# Patient Record
Sex: Female | Born: 2000 | Race: White | Hispanic: No | Marital: Married | Smoking: Never smoker
Health system: Southern US, Community
[De-identification: ages and names within clinical notes are randomized; demographics above are authoritative.]

## PROBLEM LIST (undated history)

## (undated) DIAGNOSIS — F419 Anxiety disorder, unspecified: Secondary | ICD-10-CM

## (undated) DIAGNOSIS — R1013 Epigastric pain: Secondary | ICD-10-CM

## (undated) DIAGNOSIS — L83 Acanthosis nigricans: Secondary | ICD-10-CM

## (undated) DIAGNOSIS — R000 Tachycardia, unspecified: Secondary | ICD-10-CM

## (undated) DIAGNOSIS — E669 Obesity, unspecified: Secondary | ICD-10-CM

## (undated) DIAGNOSIS — Z7289 Other problems related to lifestyle: Secondary | ICD-10-CM

## (undated) DIAGNOSIS — F329 Major depressive disorder, single episode, unspecified: Secondary | ICD-10-CM

## (undated) DIAGNOSIS — Z9289 Personal history of other medical treatment: Secondary | ICD-10-CM

## (undated) DIAGNOSIS — F32A Depression, unspecified: Secondary | ICD-10-CM

## (undated) DIAGNOSIS — F319 Bipolar disorder, unspecified: Secondary | ICD-10-CM

## (undated) DIAGNOSIS — K219 Gastro-esophageal reflux disease without esophagitis: Secondary | ICD-10-CM

## (undated) DIAGNOSIS — IMO0001 Reserved for inherently not codable concepts without codable children: Secondary | ICD-10-CM

## (undated) DIAGNOSIS — N920 Excessive and frequent menstruation with regular cycle: Secondary | ICD-10-CM

## (undated) DIAGNOSIS — E049 Nontoxic goiter, unspecified: Secondary | ICD-10-CM

## (undated) DIAGNOSIS — E282 Polycystic ovarian syndrome: Secondary | ICD-10-CM

## (undated) DIAGNOSIS — E119 Type 2 diabetes mellitus without complications: Secondary | ICD-10-CM

## (undated) HISTORY — DX: Acanthosis nigricans: L83

## (undated) HISTORY — DX: Obesity, unspecified: E66.9

## (undated) HISTORY — DX: Excessive and frequent menstruation with regular cycle: N92.0

## (undated) HISTORY — DX: Reserved for inherently not codable concepts without codable children: IMO0001

## (undated) HISTORY — DX: Gastro-esophageal reflux disease without esophagitis: K21.9

## (undated) HISTORY — DX: Tachycardia, unspecified: R00.0

## (undated) HISTORY — DX: Epigastric pain: R10.13

## (undated) HISTORY — DX: Personal history of other medical treatment: Z92.89

## (undated) HISTORY — DX: Nontoxic goiter, unspecified: E04.9

## (undated) HISTORY — PX: TYMPANOSTOMY TUBE PLACEMENT: SHX32

---
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
## 2018-01-16 ENCOUNTER — Ambulatory Visit: Payer: BLUE CROSS/BLUE SHIELD | Admitting: Family Medicine

## 2018-01-16 ENCOUNTER — Encounter: Payer: Self-pay | Admitting: Family Medicine

## 2018-01-16 VITALS — BP 131/81 | HR 63 | Temp 98.5°F | Resp 17 | Ht 65.0 in | Wt 220.0 lb

## 2018-01-16 DIAGNOSIS — Z113 Encounter for screening for infections with a predominantly sexual mode of transmission: Secondary | ICD-10-CM | POA: Diagnosis not present

## 2018-01-16 DIAGNOSIS — Z30431 Encounter for routine checking of intrauterine contraceptive device: Secondary | ICD-10-CM | POA: Diagnosis not present

## 2018-01-16 NOTE — Progress Notes (Signed)
1/14/202011:55 AM  [name] 05/21/2000, 18 y.o. female [id number]  Chief Complaint  Patient presents with  . Exposure to STD    HPI:   Patient is a 18 y.o. female who presents today for concerns of STD exposure  Her now ex-finance has cheated on her They had been having unprotected sex Denies any vaginal discharge, pelvic pain, lower abd pain, nausea, vomiting, fever or chills Did have some break through bleeding this morning Has mirena IUD in place, requesting string check Denies any h/o STDs Has completed HBV and HPV vaccine series  Fall Risk  01/16/2018 06/23/2016 06/06/2016 02/27/2016 10/05/2014  Falls in the past year? 0 No No No No     Depression screen [facility] 2/9 01/16/2018 06/23/2016 06/06/2016  Decreased Interest 0 0 0  Down, Depressed, Hopeless 0 0 0  PHQ - 2 Score 0 0 0  Altered sleeping 0 - -  Tired, decreased energy 0 - -  Change in appetite 0 - -  Feeling bad or failure about yourself  0 - -  Trouble concentrating 0 - -  Moving slowly or fidgety/restless 0 - -  Suicidal thoughts 0 - -  PHQ-9 Score 0 - -  Difficult doing work/chores Not difficult at all - -    No Known Allergies  Prior to Admission medications   Medication Sig Start Date End Date Taking? Authorizing Provider  glucose blood (BAYER CONTOUR NEXT TEST) test strip Check sugar 6x day Patient not taking: Reported on 01/16/2018 04/30/14   Verneda Skill, FNP  metFORMIN (GLUCOPHAGE-XR) 500 MG 24 hr tablet Take three tablets by mouth once daily with supper. Patient not taking: Reported on 06/23/2016 06/15/15   Verneda Skill, FNP  ondansetron (ZOFRAN) 4 MG tablet Take 1 tablet (4 mg total) by mouth every 8 (eight) hours as needed for nausea or vomiting. Patient not taking: Reported on 01/16/2018 06/08/16   Sherren Mocha, MD  ranitidine (ZANTAC) 150 MG tablet Take 1 tablet (150 mg total) by mouth 2 (two) times daily. Patient not taking: Reported on 06/06/2016 06/03/16   Clancy Gourd, NP    Past  Medical History:  Diagnosis Date  . Anxiety   . Bipolar affective (HCC)    with depression and anxiety.  . Deliberate self-cutting    last done 1 month ago to left arm  . Depression   . Diabetes mellitus without complication (HCC)    Type II  . Menorrhagia    required transfusion in 04/2013  . Obesity   . Polycystic ovary disease   . Reflux     Past Surgical History:  Procedure Laterality Date  . TYMPANOSTOMY TUBE PLACEMENT      Social History   Tobacco Use  . Smoking status: Never Smoker  . Smokeless tobacco: Never Used  . Tobacco comment: 95 year old brother smokes outside  Substance Use Topics  . Alcohol use: No    Alcohol/week: 0.0 standard drinks    Family History  Problem Relation Age of Onset  . Diabetes Mother   . Bipolar disorder Father   . Heart disease Father 75       AMI  . Diabetes Maternal Aunt   . Diabetes Maternal Uncle   . Heart disease Maternal Uncle   . Cancer Paternal Aunt        liver cancer  . Cancer Maternal Grandmother 17       lung cancer  . Hypertension Maternal Grandmother   . Diabetes Maternal Grandfather   .  Heart disease Maternal Grandfather   . Schizophrenia Paternal Grandmother     ROS Per hpi  OBJECTIVE:  Blood pressure 131/81, pulse 63, temperature 98.5 F (36.9 C), temperature source Oral, resp. rate 17, height 5\' 5"  (1.651 m), weight 220 lb (99.8 kg), SpO2 98 %. Body mass index is 36.61 kg/m.   Physical Exam Vitals signs and nursing note reviewed. Exam conducted with a chaperone present.  Constitutional:      Appearance: She is well-developed.  HENT:     Head: Normocephalic and atraumatic.  Eyes:     General: No scleral icterus.    Conjunctiva/sclera: Conjunctivae normal.     Pupils: Pupils are equal, round, and reactive to light.  Neck:     Musculoskeletal: Neck supple.  Pulmonary:     Effort: Pulmonary effort is normal.  Genitourinary:    Labia:        Right: No rash or lesion.        Left: No rash or  lesion.      Vagina: Normal.     Cervix: Normal.     Comments: Strings visualized at os Skin:    General: Skin is warm and dry.  Neurological:     Mental Status: She is alert and oriented to person, place, and time.     ASSESSMENT and PLAN  1. Screening for STD (sexually transmitted disease) - WET PREP FOR TRICH, YEAST, CLUE - RPR - HIV Antibody (routine testing w rflx) - GC/Chlamydia Probe Amp([facility])  2. IUD check up Strings visualized   No follow-ups on file.    Myles Lipps, MD Primary Care at [facility] [address] [place], [place] [address] Ph.  [phone] Fax [phone]

## 2018-01-16 NOTE — Patient Instructions (Signed)
° ° ° °  If you have lab work done today you will be contacted with your lab results within the next 2 weeks.  If you have not heard from us then please contact us. The fastest way to get your results is to register for My Chart. ° ° °IF you received an x-ray today, you will receive an invoice from [facility]. Please contact [facility] at [phone] with questions or concerns regarding your invoice.  ° °IF you received labwork today, you will receive an invoice from [facility]. Please contact [facility] at [phone] with questions or concerns regarding your invoice.  ° °Our billing staff will not be able to assist you with questions regarding bills from these companies. ° °You will be contacted with the lab results as soon as they are available. The fastest way to get your results is to activate your My Chart account. Instructions are located on the last page of this paperwork. If you have not heard from us regarding the results in 2 weeks, please contact this office. °  ° ° ° °

## 2018-01-17 LAB — WET PREP FOR TRICH, YEAST, CLUE
Clue Cell Exam: NEGATIVE
Trichomonas Exam: NEGATIVE
Yeast Exam: NEGATIVE

## 2018-01-17 LAB — HIV ANTIBODY (ROUTINE TESTING W REFLEX): HIV Screen 4th Generation wRfx: NONREACTIVE

## 2018-01-17 LAB — RPR: RPR Ser Ql: NONREACTIVE

## 2018-01-18 LAB — GC/CHLAMYDIA PROBE AMP
Chlamydia trachomatis, NAA: NEGATIVE
Neisseria gonorrhoeae by PCR: NEGATIVE

## 2018-07-19 ENCOUNTER — Ambulatory Visit (INDEPENDENT_AMBULATORY_CARE_PROVIDER_SITE_OTHER): Payer: BC Managed Care – PPO | Admitting: Family Medicine

## 2018-07-19 ENCOUNTER — Other Ambulatory Visit (HOSPITAL_COMMUNITY)
Admission: RE | Admit: 2018-07-19 | Discharge: 2018-07-19 | Disposition: A | Discharge status: Home / Self Care | Payer: BC Managed Care – PPO | Source: Ambulatory Visit | Attending: Family Medicine | Admitting: Family Medicine

## 2018-07-19 ENCOUNTER — Other Ambulatory Visit: Payer: Self-pay

## 2018-07-19 VITALS — BP 123/76 | HR 107 | Temp 98.3°F | Resp 18 | Ht 65.0 in | Wt 231.0 lb

## 2018-07-19 DIAGNOSIS — N760 Acute vaginitis: Secondary | ICD-10-CM

## 2018-07-19 DIAGNOSIS — Z202 Contact with and (suspected) exposure to infections with a predominantly sexual mode of transmission: Secondary | ICD-10-CM | POA: Insufficient documentation

## 2018-07-19 DIAGNOSIS — B9689 Other specified bacterial agents as the cause of diseases classified elsewhere: Secondary | ICD-10-CM | POA: Insufficient documentation

## 2018-07-19 LAB — POCT WET + KOH PREP
Trich by wet prep: ABSENT
Yeast by KOH: ABSENT
Yeast by wet prep: ABSENT

## 2018-07-19 MED ORDER — METRONIDAZOLE 500 MG PO TABS: 500.0000 mg | ORAL_TABLET | Freq: Two times a day (BID) | ORAL | 0 refills | Status: DC

## 2018-07-19 NOTE — Progress Notes (Signed)
Acute Office Visit  Subjective:    Patient ID: [name], female    DOB: 13-Jul-2000, 18 y.o.   MRN: [id number]  Chief Complaint  Patient presents with  . Exposure to STD    uses protection    HPI Patient is here today for STD concerns-pt states previous partner notified her that he was exposed to an STD by HD. Pt does not remember what STD reported. Pt and partner used protection but pt currently with a new partner and not using protection.  Pt has not completed a pap smear -21 in 2/21  Past Medical History:  Diagnosis Date  . Anxiety   . Bipolar affective ([place])    with depression and anxiety.  . Deliberate self-cutting    last done 1 month ago to left arm  . Depression   . Diabetes mellitus without complication ([place])    Type II  . Menorrhagia    required transfusion in 04/2013  . Obesity   . Polycystic ovary disease   . Reflux     Past Surgical History:  Procedure Laterality Date  . TYMPANOSTOMY TUBE PLACEMENT      Family History  Problem Relation Age of Onset  . Diabetes Mother   . Bipolar disorder Father   . Heart disease Father 80       AMI  . Diabetes Maternal Aunt   . Diabetes Maternal Uncle   . Heart disease Maternal Uncle   . Cancer Paternal Aunt        liver cancer  . Cancer Maternal Grandmother 29       lung cancer  . Hypertension Maternal Grandmother   . Diabetes Maternal Grandfather   . Heart disease Maternal Grandfather   . Schizophrenia Paternal Grandmother     Social History   Socioeconomic History  . Marital status: Single    Spouse name: Not on file  . Number of children: 0  . Years of education: Not on file  . Highest education level: Not on file  Occupational History  . Not on file  Social Needs  . Financial resource strain: Not on file  . Food insecurity    Worry: Not on file    Inability: Not on file  . Transportation needs    Medical: Not on file    Non-medical: Not on file  Tobacco Use  . Smoking status: Never  Smoker  . Smokeless tobacco: Never Used  . Tobacco comment: 29 year old brother smokes outside  Substance and Sexual Activity  . Alcohol use: No    Alcohol/week: 0.0 standard drinks  . Drug use: No  . Sexual activity: Yes    Partners: Male    Birth control/protection: Pill, I.U.D.    Comment: 04/27/2016 unprotected   Lifestyle  . Physical activity    Days per week: Not on file    Minutes per session: Not on file  . Stress: Not on file  Relationships  . Social Herbalist on phone: Not on file    Gets together: Not on file    Attends religious service: Not on file    Active member of club or organization: Not on file    Attends meetings of clubs or organizations: Not on file    Relationship status: Not on file  . Intimate partner violence    Fear of current or ex partner: Not on file    Emotionally abused: Not on file    Physically abused: Not on  file    Forced sexual activity: Not on file  Other Topics Concern  . Not on file  Social History Narrative   Lives at home with mother, father, and 20 year old brother.    Family history includes mother with a hysterectomy due to bleeding after birth of second child.    Outpatient Medications Prior to Visit  Medication Sig Dispense Refill  . glucose blood (BAYER CONTOUR NEXT TEST) test strip Check sugar 6x day (Patient not taking: Reported on 01/16/2018) 200 each 12  . metFORMIN (GLUCOPHAGE-XR) 500 MG 24 hr tablet Take three tablets by mouth once daily with supper. (Patient not taking: Reported on 06/23/2016) 90 tablet 3  . ondansetron (ZOFRAN) 4 MG tablet Take 1 tablet (4 mg total) by mouth every 8 (eight) hours as needed for nausea or vomiting. (Patient not taking: Reported on 01/16/2018) 20 tablet 0  . ranitidine (ZANTAC) 150 MG tablet Take 1 tablet (150 mg total) by mouth 2 (two) times daily. (Patient not taking: Reported on 06/06/2016) 60 tablet 0   No facility-administered medications prior to visit.     No Known  Allergies  ROS No vaginal discharge No abdominal pain    Objective:    Physical Exam  Constitutional: She appears well-developed and well-nourished.  Cardiovascular: Normal rate, regular rhythm, normal heart sounds and intact distal pulses.  Pulmonary/Chest: Effort normal and breath sounds normal.  Abdominal: Soft. Bowel sounds are normal.  Genitourinary:    Uterus normal.     Vaginal discharge present.   bimanual-no cervical motion tenderness. Discharge green thick  BP 123/76 (BP Location: Right Arm, Patient Position: Sitting, Cuff Size: Large)   Pulse (!) 107   Temp 98.3 F (36.8 C)   Resp 18   Ht 5\' 5"  (1.651 m)   Wt 231 lb (104.8 kg)   SpO2 95%   BMI 38.44 kg/m  Wt Readings from Last 3 Encounters:  07/19/18 231 lb (104.8 kg)  01/16/18 220 lb (99.8 kg) (99 %, Z= 2.21)*  06/23/16 224 lb 9.6 oz (101.9 kg) (99 %, Z= 2.25)*   * Growth percentiles are based on CDC (Girls, 2-20 Years) data.    Health Maintenance Due  Topic Date Due  . PNEUMOCOCCAL POLYSACCHARIDE VACCINE AGE 52-64 HIGH RISK  02/12/2000  . FOOT EXAM  02/12/2008  . OPHTHALMOLOGY EXAM  02/12/2008  . URINE MICROALBUMIN  02/12/2008  . HEMOGLOBIN A1C  10/28/2016  . CHLAMYDIA SCREENING  04/28/2017     Lab Results  Component Value Date   TSH 4.443 04/13/2014   Lab Results  Component Value Date   WBC 7.6 06/23/2016   HGB 13.5 06/23/2016   HCT 38.8 06/23/2016   MCV 89.2 06/23/2016   PLT 289 04/13/2014   Lab Results  Component Value Date   NA 139 06/06/2016   K 3.9 06/06/2016   CO2 25 06/06/2016   GLUCOSE 161 (H) 06/06/2016   BUN 9 06/06/2016   CREATININE 0.81 06/06/2016   BILITOT 0.6 06/06/2016   ALKPHOS 95 06/06/2016   AST 36 06/06/2016   ALT 39 (H) 06/06/2016   PROT 7.4 06/06/2016   ALBUMIN 4.2 06/06/2016   CALCIUM 9.1 06/06/2016   ANIONGAP 6 04/14/2014   Lab Results  Component Value Date   CHOL 167 04/30/2013   Lab Results  Component Value Date   HDL 41 04/30/2013   Lab Results   Component Value Date   LDLCALC 93 04/30/2013   Lab Results  Component Value Date   TRIG  165 (H) 04/30/2013   Lab Results  Component Value Date   CHOLHDL 4.1 04/30/2013   Lab Results  Component Value Date   HGBA1C 6.5 04/28/2016       Assessment & Plan:  1. Possible exposure to STD - GC/Chlamydia probe amp ([place])not at [facility]-pending - POCT Wet + KOH Prep 2. Bacterial vaginosis Flagyl 500mg  BID x 7 days  LISA Mat CarneLEIGH CORUM, MD

## 2018-07-25 LAB — GC/CHLAMYDIA PROBE AMP (~~LOC~~) NOT AT ARMC
Chlamydia: NEGATIVE
Neisseria Gonorrhea: NEGATIVE

## 2018-07-31 NOTE — Progress Notes (Signed)
Pt received message via mychart

## 2021-06-05 NOTE — Progress Notes (Signed)
Cardiology Office Note   Date:  06/09/2021   ID:  [name], DOB 2000/10/05, MRN [id number]  PCP:  Carmel Sacramento, NP  Cardiologist:   Remmy Riffe [place], MD   Chief Complaint  Patient presents with   New Patient (Initial Visit)   Headache   Shortness of Breath   Chest Pain   Edema    Hands and ankles a lot.      History of Present Illness: [name] is a 21 y.o. female who is seen at the request of Dr Joselyn Glassman for evaluation of dizziness/lightheadedness. She has a history of bipolar disorder, DM and PCOS. She reports that since March she has experienced symptoms of tachycardia and lightheadedness. She feels "foggy headed". Once this occurred after getting out of a shower and bending over. It may happen when she is doing something strenuous. It typically occurs when she gets out of bed in the am. It has occurred when she has been standing for a long time and then sits down. No syncope. She was seen by Dr Hanley Hays at [facility]. Apparently had a Nuclear stress test and Echo that were normal. No monitor done. Was placed on metoprolol per primary care and she states her doesn't go as fast now. She has been diagnosed with DM with A1c > 8.5 and is now on metformin. She was talking to a friend who has POTs syndrome and is asking whether she needs a tilt table test.     Past Medical History:  Diagnosis Date   Anxiety    Bipolar affective ([facility])    with depression and anxiety.   Deliberate self-cutting    last done 1 month ago to left arm   Depression    Diabetes mellitus without complication ([facility])    Type II   Menorrhagia    required transfusion in 04/2013   Obesity    Polycystic ovary disease    Reflux     Past Surgical History:  Procedure Laterality Date   TYMPANOSTOMY TUBE PLACEMENT       Current Outpatient Medications  Medication Sig Dispense Refill   glucose blood (BAYER CONTOUR NEXT TEST) test strip Check sugar 6x day 200 each 12   metFORMIN (GLUCOPHAGE-XR) 500  MG 24 hr tablet Take three tablets by mouth once daily with supper. 90 tablet 3   metoprolol tartrate (LOPRESSOR) 25 MG tablet Take 25 mg by mouth daily.     metroNIDAZOLE (FLAGYL) 500 MG tablet Take 1 tablet (500 mg total) by mouth 2 (two) times daily. Take with food 14 tablet 0   norethindrone-ethinyl estradiol-FE (LOESTRIN FE) 1-20 MG-MCG tablet Take 1 tablet by mouth daily.     ondansetron (ZOFRAN) 4 MG tablet Take 1 tablet (4 mg total) by mouth every 8 (eight) hours as needed for nausea or vomiting. 20 tablet 0   ranitidine (ZANTAC) 150 MG tablet Take 1 tablet (150 mg total) by mouth 2 (two) times daily. 60 tablet 0   No current facility-administered medications for this visit.    Allergies:   Patient has no known allergies.    Social History:  The patient  reports that she has never smoked. She has never used smokeless tobacco. She reports that she does not drink alcohol and does not use drugs.   Family History:  The patient's family history includes Bipolar disorder in her father; Cancer in her paternal aunt; Cancer (age of onset: 72) in her maternal grandmother; Diabetes in her maternal aunt, maternal grandfather, maternal uncle,  and mother; Heart disease in her maternal grandfather and maternal uncle; Heart disease (age of onset: 75) in her father; Hypertension in her maternal grandmother; Schizophrenia in her paternal grandmother.    ROS:  Please see the history of present illness.   Otherwise, review of systems are positive for none.   All other systems are reviewed and negative.    PHYSICAL EXAM: VS:  BP 112/66 (BP Location: Left Arm, Patient Position: Sitting, Cuff Size: Large)   Pulse 84   Ht 5\' 5"  (1.651 m)   Wt 250 lb (113.4 kg)   BMI 41.60 kg/m  , BMI Body mass index is 41.6 kg/m. GEN: Well nourished, obese WF, in no acute distress HEENT: normal Neck: no JVD, carotid bruits, or masses Cardiac: RRR; no murmurs, rubs, or gallops,no edema  Respiratory:  clear to  auscultation bilaterally, normal work of breathing GI: soft, nontender, nondistended, + BS MS: no deformity or atrophy Skin: warm and dry, no rash Neuro:  Strength and sensation are intact Psych: euthymic mood, full affect   EKG:  EKG is ordered today. The ekg ordered today demonstrates NSR rate 84. Normal. I have personally reviewed and interpreted this study.    Recent Labs: No results found for requested labs within last 8760 hours.    Lipid Panel    Component Value Date/Time   CHOL 167 04/30/2013 1750   TRIG 165 (H) 04/30/2013 1750   HDL 41 04/30/2013 1750   CHOLHDL 4.1 04/30/2013 1750   VLDL 33 04/30/2013 1750   LDLCALC 93 04/30/2013 1750      Wt Readings from Last 3 Encounters:  06/09/21 250 lb (113.4 kg)  07/19/18 231 lb (104.8 kg)  01/16/18 220 lb (99.8 kg) (99 %, Z= 2.21)*   * Growth percentiles are based on CDC (Girls, 2-20 Years) data.      Other studies Reviewed: Additional studies/ records that were reviewed today include: none. Review of the above records demonstrates: N/A   ASSESSMENT AND PLAN:  1.  Dizziness - I think it is unlikely she has POTS. No syncope and her symptoms are not really postural. Also her dizziness does not relate to her tachycardia. I have requested a copy of her lab tests., Echo, and stress test to review. I have recommended a 2 week Zio patch monitor. We discussed tilt table testing which I think would have a low yield given clinical history. I wonder if recent diagnosis with DM may have aggravated symptoms since she may have been volume depleted with high sugar. Discussed importance of DM control and maintaining good hydration.  2. Tachycardia. Will arrange for monitor as above. Continue metoprolol.    Current medicines are reviewed at length with the patient today.  The patient does not have concerns regarding medicines.  The following changes have been made:  no change  Labs/ tests ordered today include:   Orders Placed  This Encounter  Procedures   LONG TERM MONITOR (3-14 DAYS)   EKG 12-Lead         Disposition:   FU after monitor.   Signed, Kim Lauver 01/18/18, MD  06/09/2021 4:38 PM    [facility] [address] [place], [place], [place] Phone [phone], Fax [phone]

## 2021-06-08 ENCOUNTER — Other Ambulatory Visit (HOSPITAL_COMMUNITY): Payer: Self-pay | Admitting: Family Medicine

## 2021-06-08 DIAGNOSIS — R079 Chest pain, unspecified: Secondary | ICD-10-CM

## 2021-06-09 ENCOUNTER — Ambulatory Visit: Payer: 59 | Admitting: Cardiology

## 2021-06-09 ENCOUNTER — Ambulatory Visit (INDEPENDENT_AMBULATORY_CARE_PROVIDER_SITE_OTHER): Payer: 59

## 2021-06-09 ENCOUNTER — Encounter: Payer: Self-pay | Admitting: Cardiology

## 2021-06-09 VITALS — BP 112/66 | HR 84 | Ht 65.0 in | Wt 250.0 lb

## 2021-06-09 DIAGNOSIS — R000 Tachycardia, unspecified: Secondary | ICD-10-CM

## 2021-06-09 DIAGNOSIS — R42 Dizziness and giddiness: Secondary | ICD-10-CM

## 2021-06-09 NOTE — Progress Notes (Unsigned)
Enrolled for [employer] to mail a ZIO XT long term holter monitor to the patients address on file.  

## 2021-06-09 NOTE — Patient Instructions (Signed)
Medication Instructions:  NO CHANGES  *If you need a refill on your cardiac medications before your next appointment, please call your pharmacy*   Testing/Procedures: [place] Monitor Instructions  Your physician has requested you wear a ZIO patch monitor for 14 days.  This is a single patch monitor. [employer] supplies one patch monitor per enrollment. Additional stickers are not available. Please do not apply patch if you will be having a Nuclear Stress Test,  Echocardiogram, Cardiac CT, MRI, or Chest Xray during the period you would be wearing the  monitor. The patch cannot be worn during these tests. You cannot remove and re-apply the  ZIO XT patch monitor.  Your ZIO patch monitor will be mailed 3 day USPS to your address on file. It may take 3-5 days  to receive your monitor after you have been enrolled.  Once you have received your monitor, please review the enclosed instructions. Your monitor  has already been registered assigning a specific monitor serial # to you.  Billing and Patient Assistance Program Information  We have supplied [employer] with any of your insurance information on file for billing purposes. [employer] offers a sliding scale Patient Assistance Program for patients that do not have  insurance, or whose insurance does not completely cover the cost of the ZIO monitor.  You must apply for the Patient Assistance Program to qualify for this discounted rate.  To apply, please call [employer] at [phone], select option 4, select option 2, ask to apply for  Patient Assistance Program. Theodore Demark will ask your household income, and how many people  are in your household. They will quote your out-of-pocket cost based on that information.  [employer] will also be able to set up a 79-month, interest-free payment plan if needed.  Applying the monitor   Shave hair from upper left chest.  Hold abrader disc by orange tab. Rub abrader in 40 strokes over the upper left chest as   indicated in your monitor instructions.  Clean area with 4 enclosed alcohol pads. Let dry.  Apply patch as indicated in monitor instructions. Patch will be placed under collarbone on left  side of chest with arrow pointing upward.  Rub patch adhesive wings for 2 minutes. Remove white label marked "1". Remove the white  label marked "2". Rub patch adhesive wings for 2 additional minutes.  While looking in a mirror, press and release button in center of patch. A small green light will  flash 3-4 times. This will be your only indicator that the monitor has been turned on.  Do not shower for the first 24 hours. You may shower after the first 24 hours.  Press the button if you feel a symptom. You will hear a small click. Record Date, Time and  Symptom in the Patient Logbook.  When you are ready to remove the patch, follow instructions on the last 2 pages of Patient  Logbook. Stick patch monitor onto the last page of Patient Logbook.  Place Patient Logbook in the blue and white box. Use locking tab on box and tape box closed  securely. The blue and white box has prepaid postage on it. Please place it in the mailbox as  soon as possible. Your physician should have your test results approximately 7 days after the  monitor has been mailed back to [facility].  Call [facility] at [phone] if you have questions regarding  your ZIO XT patch monitor. Call them immediately if you see an orange  light blinking on your  monitor.  If your monitor falls off in less than 4 days, contact our Monitor department at [phone].  If your monitor becomes loose or falls off after 4 days call [employer] at [phone] for  suggestions on securing your monitor    Follow-Up: At [facility], you and your health needs are our priority.  As part of our continuing mission to provide you with exceptional heart care, we have created designated Provider Care Teams.  These Care Teams include  your primary Cardiologist (physician) and Advanced Practice Providers (APPs -  Physician Assistants and Nurse Practitioners) who all work together to provide you with the care you need, when you need it.  We recommend signing up for the patient portal called "MyChart".  Sign up information is provided on this After Visit Summary.  MyChart is used to connect with patients for Virtual Visits (Telemedicine).  Patients are able to view lab/test results, encounter notes, upcoming appointments, etc.  Non-urgent messages can be sent to your provider as well.   To learn more about what you can do with MyChart, go to [email or web address].    Your next appointment:   4-6 weeks with Dr. [place]

## 2021-06-11 DIAGNOSIS — R000 Tachycardia, unspecified: Secondary | ICD-10-CM

## 2021-06-11 DIAGNOSIS — R42 Dizziness and giddiness: Secondary | ICD-10-CM | POA: Diagnosis not present

## 2021-07-13 NOTE — Progress Notes (Deleted)
Cardiology Office Note   Date:  07/13/2021   ID:  [name], DOB 2000-09-17, MRN [id number]  PCP:  Carmel Sacramento, NP  Cardiologist:   Ziare Orrick [place], MD   No chief complaint on file.     History of Present Illness: [name] is a 21 y.o. female who is seen for follow up  of dizziness/lightheadedness. She has a history of bipolar disorder, DM and PCOS. She reports that since March she has experienced symptoms of tachycardia and lightheadedness. She feels "foggy headed". Once this occurred after getting out of a shower and bending over. It may happen when she is doing something strenuous. It typically occurs when she gets out of bed in the am. It has occurred when she has been standing for a long time and then sits down. No syncope. She was seen by Dr Hanley Hays at [facility]. Apparently had a Nuclear stress test and Echo that were normal. No monitor done. Was placed on metoprolol per primary care and she states her doesn't go as fast now. She has been diagnosed with DM with A1c > 8.5 and is now on metformin. She was talking to a friend who has POTs syndrome and is asking whether she needs a tilt table test. We had her wear an event monitor. This showed NSR with average rate of 85. One episode with HR up to 180. Symptoms on one occasion associated with HR 130 but other 2 occasions had normal HR.     Past Medical History:  Diagnosis Date   Anxiety    Bipolar affective ([facility])    with depression and anxiety.   Deliberate self-cutting    last done 1 month ago to left arm   Depression    Diabetes mellitus without complication ([facility])    Type II   Menorrhagia    required transfusion in 04/2013   Obesity    Polycystic ovary disease    Reflux     Past Surgical History:  Procedure Laterality Date   TYMPANOSTOMY TUBE PLACEMENT       Current Outpatient Medications  Medication Sig Dispense Refill   glucose blood (BAYER CONTOUR NEXT TEST) test strip Check sugar 6x day 200 each  12   metFORMIN (GLUCOPHAGE-XR) 500 MG 24 hr tablet Take three tablets by mouth once daily with supper. 90 tablet 3   metoprolol tartrate (LOPRESSOR) 25 MG tablet Take 25 mg by mouth daily.     metroNIDAZOLE (FLAGYL) 500 MG tablet Take 1 tablet (500 mg total) by mouth 2 (two) times daily. Take with food 14 tablet 0   norethindrone-ethinyl estradiol-FE (LOESTRIN FE) 1-20 MG-MCG tablet Take 1 tablet by mouth daily.     ondansetron (ZOFRAN) 4 MG tablet Take 1 tablet (4 mg total) by mouth every 8 (eight) hours as needed for nausea or vomiting. 20 tablet 0   ranitidine (ZANTAC) 150 MG tablet Take 1 tablet (150 mg total) by mouth 2 (two) times daily. 60 tablet 0   No current facility-administered medications for this visit.    Allergies:   Patient has no known allergies.    Social History:  The patient  reports that she has never smoked. She has never used smokeless tobacco. She reports that she does not drink alcohol and does not use drugs.   Family History:  The patient's family history includes Bipolar disorder in her father; Cancer in her paternal aunt; Cancer (age of onset: 51) in her maternal grandmother; Diabetes in her maternal aunt, maternal grandfather,  maternal uncle, and mother; Heart disease in her maternal grandfather and maternal uncle; Heart disease (age of onset: 83) in her father; Hypertension in her maternal grandmother; Schizophrenia in her paternal grandmother.    ROS:  Please see the history of present illness.   Otherwise, review of systems are positive for none.   All other systems are reviewed and negative.    PHYSICAL EXAM: VS:  There were no vitals taken for this visit. , BMI There is no height or weight on file to calculate BMI. GEN: Well nourished, obese WF, in no acute distress HEENT: normal Neck: no JVD, carotid bruits, or masses Cardiac: RRR; no murmurs, rubs, or gallops,no edema  Respiratory:  clear to auscultation bilaterally, normal work of breathing GI: soft,  nontender, nondistended, + BS MS: no deformity or atrophy Skin: warm and dry, no rash Neuro:  Strength and sensation are intact Psych: euthymic mood, full affect   EKG:  EKG is ordered today. The ekg ordered today demonstrates NSR rate 84. Normal. I have personally reviewed and interpreted this study.    Recent Labs: No results found for requested labs within last 365 days.    Lipid Panel    Component Value Date/Time   CHOL 167 04/30/2013 1750   TRIG 165 (H) 04/30/2013 1750   HDL 41 04/30/2013 1750   CHOLHDL 4.1 04/30/2013 1750   VLDL 33 04/30/2013 1750   LDLCALC 93 04/30/2013 1750      Wt Readings from Last 3 Encounters:  06/09/21 250 lb (113.4 kg)  07/19/18 231 lb (104.8 kg)  01/16/18 220 lb (99.8 kg) (99 %, Z= 2.21)*   * Growth percentiles are based on CDC (Girls, 2-20 Years) data.      Other studies Reviewed: Additional studies/ records that were reviewed today include:   Event monitor 07/01/21: Study Highlights    Normal sinus rhythm with average HR 85. Peak HR up to 180 at 3:12 pm. Symptoms of dizziness and brain fog correlated on one occasion with sinus tachycardia at rate of 130 bpm but on 2 other occasions with normal HR No significant ectopy     Patch Wear Time:  13 days and 0 hours (2023-06-09T13:21:36-0400 to 2023-06-22T13:46:55-0400)   Patient had a min HR of 45 bpm, max HR of 180 bpm, and avg HR of 85 bpm. Predominant underlying rhythm was Sinus Rhythm. Isolated SVEs were rare (<1.0%), and no SVE Couplets or SVE Triplets were present. Isolated VEs were rare (<1.0%), and no VE Couplets  or VE Triplets were present.      ASSESSMENT AND PLAN:  1.  Dizziness - I think it is unlikely she has POTS. No syncope and her symptoms are not really postural. Also her dizziness does not relate to her tachycardia. I have requested a copy of her lab tests., Echo, and stress test to review. I have recommended a 2 week Zio patch monitor. We discussed tilt table  testing which I think would have a low yield given clinical history. I wonder if recent diagnosis with DM may have aggravated symptoms since she may have been volume depleted with high sugar. Discussed importance of DM control and maintaining good hydration.  2. Tachycardia. Will arrange for monitor as above. Continue metoprolol.    Current medicines are reviewed at length with the patient today.  The patient does not have concerns regarding medicines.  The following changes have been made:  no change  Labs/ tests ordered today include:   No orders of the defined  types were placed in this encounter.        Disposition:   FU after monitor.   Signed, Cherilyn Sautter [place], MD  07/13/2021 3:57 PM    [facility] [address], [place], [place], [address] Phone [phone], Fax [phone]

## 2021-07-19 ENCOUNTER — Ambulatory Visit: Payer: 59 | Admitting: Cardiology

## 2022-04-11 ENCOUNTER — Ambulatory Visit (HOSPITAL_BASED_OUTPATIENT_CLINIC_OR_DEPARTMENT_OTHER): Payer: 59 | Admitting: Obstetrics & Gynecology

## 2022-04-11 ENCOUNTER — Other Ambulatory Visit (HOSPITAL_COMMUNITY)
Admission: RE | Admit: 2022-04-11 | Discharge: 2022-04-11 | Disposition: A | Discharge status: Home / Self Care | Payer: 59 | Source: Ambulatory Visit | Attending: Obstetrics & Gynecology | Admitting: Obstetrics & Gynecology

## 2022-04-11 ENCOUNTER — Encounter (HOSPITAL_BASED_OUTPATIENT_CLINIC_OR_DEPARTMENT_OTHER): Payer: Self-pay | Admitting: Obstetrics & Gynecology

## 2022-04-11 VITALS — BP 113/63 | HR 79 | Ht 65.0 in | Wt 227.6 lb

## 2022-04-11 DIAGNOSIS — Z113 Encounter for screening for infections with a predominantly sexual mode of transmission: Secondary | ICD-10-CM

## 2022-04-11 DIAGNOSIS — E282 Polycystic ovarian syndrome: Secondary | ICD-10-CM | POA: Diagnosis not present

## 2022-04-11 DIAGNOSIS — R000 Tachycardia, unspecified: Secondary | ICD-10-CM

## 2022-04-11 DIAGNOSIS — Z124 Encounter for screening for malignant neoplasm of cervix: Secondary | ICD-10-CM | POA: Diagnosis present

## 2022-04-11 DIAGNOSIS — L732 Hidradenitis suppurativa: Secondary | ICD-10-CM

## 2022-04-11 DIAGNOSIS — Z01419 Encounter for gynecological examination (general) (routine) without abnormal findings: Secondary | ICD-10-CM

## 2022-04-11 MED ORDER — CLINDAMYCIN PHOSPHATE 1 % EX SOLN
Freq: Two times a day (BID) | CUTANEOUS | 1 refills | Status: DC
Start: 2022-04-11 — End: 2022-12-17

## 2022-04-11 NOTE — Patient Instructions (Signed)
1) come back for the fasting insulin level.  Four hours with not food or drink with fat or caloires.  2) if elevated, consider medication like glipizide to help with ovulation  3) start counting from the first day of your last cycle.  If you get to day 40 with no bleeding, call.  If your cycle is early or later (not on day 28-30), then you probably didn't ovulate so call.  4) Would consider starting femara (ovulation induction agent) with next menstrual cycle.  You take this medication starting on day 5 (with day 1 being the first day of bleeding).

## 2022-04-11 NOTE — Progress Notes (Unsigned)
22 y.o. [name] or Caucasian female here for annual exam/new patient exam.  She has been on OCPs to cycle regulation.  She's never had regular cycles.  On munjaro x 9-10 months.  Hba1c was 8.5 and now is 5.8.  Most recent hba1c was 5.4 was early March.  This is being managed by Carmel Sacramento, NP.    Would like to consider pregnancy.  Has been on metformin and did not do well with this.  Has GI distress.  Fasting insulin recommended with possible additional agent for treatment.  Stopping Munjaro recommended as well if actually trying for pregnancy.  Ovulation induction agents discussed.  Dosing, side effects, and twin/triplet rate discussed.  Pt may need provera to start cycle as well.  Ready to start family.  Instructions written down for pt as well.  No LMP recorded.          Sexually active: Yes.    The current method of family planning is none.    Smoker:  just quit  Health Maintenance: Pap:  obtained today History of abnormal Pap:  no MMG:  guidelines reviewed Colonoscopy:  guidelines reviewed Screening Labs: does with PCP   reports that she has never smoked. She has never used smokeless tobacco. She reports that she does not drink alcohol and does not use drugs.  Past Medical History:  Diagnosis Date   Anxiety    Bipolar affective    with depression and anxiety.   Deliberate self-cutting    last done 1 month ago to left arm   Depression    Diabetes mellitus without complication    Type II   Menorrhagia    required transfusion in 04/2013   Obesity    Polycystic ovary disease    Reflux     Past Surgical History:  Procedure Laterality Date   TYMPANOSTOMY TUBE PLACEMENT      Current Outpatient Medications  Medication Sig Dispense Refill   metFORMIN (GLUCOPHAGE-XR) 500 MG 24 hr tablet Take three tablets by mouth once daily with supper. 90 tablet 3   metoprolol tartrate (LOPRESSOR) 25 MG tablet Take 25 mg by mouth daily.     norethindrone-ethinyl estradiol-FE (LOESTRIN FE)  1-20 MG-MCG tablet Take 1 tablet by mouth daily.     ondansetron (ZOFRAN) 4 MG tablet Take 1 tablet (4 mg total) by mouth every 8 (eight) hours as needed for nausea or vomiting. 20 tablet 0   No current facility-administered medications for this visit.    Family History  Problem Relation Age of Onset   Diabetes Mother    Bipolar disorder Father    Heart disease Father 21       AMI   Diabetes Maternal Aunt    Diabetes Maternal Uncle    Heart disease Maternal Uncle    Cancer Paternal Aunt        liver cancer   Cancer Maternal Grandmother 102       lung cancer   Hypertension Maternal Grandmother    Diabetes Maternal Grandfather    Heart disease Maternal Grandfather    Schizophrenia Paternal Grandmother     ROS: Constitutional: negative Genitourinary:negative  Exam:   BP 113/63 (BP Location: Right Arm, Patient Position: Sitting, Cuff Size: Large)   Pulse 79   Ht 5\' 5"  (1.651 m) Comment: Reported  Wt 227 lb 9.6 oz (103.2 kg)   BMI 37.87 kg/m   Height: 5\' 5"  (165.1 cm) (Reported)  General appearance: alert, cooperative and appears stated age Head: Normocephalic,  without obvious abnormality, atraumatic Neck: no adenopathy, supple, symmetrical, trachea midline and thyroid normal to inspection and palpation Lungs: clear to auscultation bilaterally Breasts: normal appearance, no masses or tenderness Heart: regular rate and rhythm Abdomen: soft, non-tender; bowel sounds normal; no masses,  no organomegaly Extremities: extremities normal, atraumatic, no cyanosis or edema Skin: Skin color, texture, turgor normal. No rashes or lesions.  Scarring on inner thighs, superficial c/w hidradenitis  Lymph nodes: Cervical, supraclavicular, and axillary nodes normal. No abnormal inguinal nodes palpated Neurologic: Grossly normal   Pelvic: External genitalia:  no lesions              Urethra:  normal appearing urethra with no masses, tenderness or lesions              Bartholins and  Skenes: normal                 Vagina: normal appearing vagina with normal color and no discharge, no lesions              Cervix: no cervical motion tenderness              Pap taken: Yes.   Bimanual Exam:  Uterus:  normal size, contour, position, consistency, mobility, non-tender              Adnexa: normal adnexa and no mass, fullness, tenderness               Rectovaginal: Confirms               Anus:  normal sphincter tone, no lesions  Chaperone, Ina Homes, CMA, was present for exam.  Assessment/Plan: 1. Well woman exam with routine gynecological exam - Pap smear obtained today - Mammogram guidelines reviewed - Colonoscopy guidelines reviewed - lab work:  insulin obtained today - vaccines reviewed/updated  2. PCOS (polycystic ovarian syndrome) with likely oligoovulation - recommended starting PNV - Insulin, random; Future - if no cycle in 40 days, pt to call for provera challenge and then possibly starting femara  3. Screening examination for STD (sexually transmitted disease) - GC/Chl obtained from pap smears  4. Hidradenitis - will start with first line agent for treatment - clindamycin (CLEOCIN T) 1 % external solution; Apply topically 2 (two) times daily.  Dispense: 30 mL; Refill: 1  In addition to new gyn exam, about 20 minutes spent discussing PCOS, ovulation, possible treatment with metformin or other oral diabetes agent, cycles and ovulation induction agents.

## 2022-04-14 ENCOUNTER — Other Ambulatory Visit (HOSPITAL_BASED_OUTPATIENT_CLINIC_OR_DEPARTMENT_OTHER): Payer: 59

## 2022-04-14 DIAGNOSIS — E785 Hyperlipidemia, unspecified: Secondary | ICD-10-CM

## 2022-04-14 DIAGNOSIS — E282 Polycystic ovarian syndrome: Secondary | ICD-10-CM

## 2022-04-15 LAB — INSULIN, RANDOM: INSULIN: 162 u[IU]/mL — ABNORMAL HIGH (ref 2.6–24.9)

## 2022-04-19 LAB — CYTOLOGY - PAP
Chlamydia: NEGATIVE
Comment: NEGATIVE
Comment: NEGATIVE
Comment: NORMAL
Diagnosis: UNDETERMINED — AB
High risk HPV: NEGATIVE
Neisseria Gonorrhea: NEGATIVE

## 2022-04-22 ENCOUNTER — Other Ambulatory Visit (HOSPITAL_BASED_OUTPATIENT_CLINIC_OR_DEPARTMENT_OTHER): Payer: 59

## 2022-04-22 ENCOUNTER — Other Ambulatory Visit: Payer: Self-pay | Admitting: Obstetrics & Gynecology

## 2022-04-22 DIAGNOSIS — R899 Unspecified abnormal finding in specimens from other organs, systems and tissues: Secondary | ICD-10-CM

## 2022-04-22 DIAGNOSIS — E785 Hyperlipidemia, unspecified: Secondary | ICD-10-CM

## 2022-04-22 LAB — HEMOGLOBIN A1C
Est. average glucose Bld gHb Est-mCnc: 105 mg/dL
Hgb A1c MFr Bld: 5.3 % (ref 4.8–5.6)

## 2022-04-22 LAB — LIPID PANEL
Chol/HDL Ratio: 4.4 ratio (ref 0.0–4.4)
Cholesterol, Total: 191 mg/dL (ref 100–199)
HDL: 43 mg/dL (ref >39)
LDL Chol Calc (NIH): 129 mg/dL — ABNORMAL HIGH (ref 0–99)
Triglycerides: 105 mg/dL (ref 0–149)
VLDL Cholesterol Cal: 19 mg/dL (ref 5–40)

## 2022-04-23 NOTE — Addendum Note (Signed)
Addended by: Jerene Bears on: 04/23/2022 06:59 AM   Modules accepted: Orders

## 2022-04-29 ENCOUNTER — Other Ambulatory Visit: Payer: Self-pay | Admitting: Obstetrics & Gynecology

## 2022-04-29 ENCOUNTER — Other Ambulatory Visit (HOSPITAL_BASED_OUTPATIENT_CLINIC_OR_DEPARTMENT_OTHER): Payer: 59

## 2022-04-29 DIAGNOSIS — R899 Unspecified abnormal finding in specimens from other organs, systems and tissues: Secondary | ICD-10-CM

## 2022-04-30 LAB — INSULIN, RANDOM: INSULIN: 46.7 u[IU]/mL — ABNORMAL HIGH (ref 2.6–24.9)

## 2022-05-05 ENCOUNTER — Other Ambulatory Visit (HOSPITAL_BASED_OUTPATIENT_CLINIC_OR_DEPARTMENT_OTHER): Payer: 59

## 2022-05-05 DIAGNOSIS — N926 Irregular menstruation, unspecified: Secondary | ICD-10-CM

## 2022-05-06 LAB — BETA HCG QUANT (REF LAB)

## 2022-05-09 NOTE — Addendum Note (Signed)
Addended by: Jerene Bears on: 05/09/2022 07:12 AM   Modules accepted: Orders

## 2022-05-11 ENCOUNTER — Other Ambulatory Visit (HOSPITAL_BASED_OUTPATIENT_CLINIC_OR_DEPARTMENT_OTHER): Payer: 59

## 2022-05-11 DIAGNOSIS — N926 Irregular menstruation, unspecified: Secondary | ICD-10-CM

## 2022-05-12 LAB — BETA HCG QUANT (REF LAB): hCG Quant: 2354 m[IU]/mL

## 2022-05-13 ENCOUNTER — Other Ambulatory Visit (HOSPITAL_BASED_OUTPATIENT_CLINIC_OR_DEPARTMENT_OTHER): Payer: 59

## 2022-05-13 ENCOUNTER — Other Ambulatory Visit (HOSPITAL_BASED_OUTPATIENT_CLINIC_OR_DEPARTMENT_OTHER): Payer: Self-pay

## 2022-05-13 DIAGNOSIS — N926 Irregular menstruation, unspecified: Secondary | ICD-10-CM

## 2022-05-14 LAB — BETA HCG QUANT (REF LAB): hCG Quant: 4181 m[IU]/mL

## 2022-05-25 ENCOUNTER — Ambulatory Visit (INDEPENDENT_AMBULATORY_CARE_PROVIDER_SITE_OTHER): Payer: 59

## 2022-05-25 ENCOUNTER — Encounter (HOSPITAL_BASED_OUTPATIENT_CLINIC_OR_DEPARTMENT_OTHER): Payer: Self-pay

## 2022-05-25 VITALS — BP 124/45 | HR 63 | Ht 65.0 in | Wt 240.8 lb

## 2022-05-25 DIAGNOSIS — O3680X1 Pregnancy with inconclusive fetal viability, fetus 1: Secondary | ICD-10-CM

## 2022-05-25 DIAGNOSIS — Z3689 Encounter for other specified antenatal screening: Secondary | ICD-10-CM

## 2022-05-25 DIAGNOSIS — Z3A01 Less than 8 weeks gestation of pregnancy: Secondary | ICD-10-CM | POA: Diagnosis not present

## 2022-06-15 ENCOUNTER — Ambulatory Visit (INDEPENDENT_AMBULATORY_CARE_PROVIDER_SITE_OTHER): Payer: 59 | Admitting: *Deleted

## 2022-06-15 ENCOUNTER — Other Ambulatory Visit (HOSPITAL_COMMUNITY)
Admission: RE | Admit: 2022-06-15 | Discharge: 2022-06-15 | Disposition: A | Discharge status: Home / Self Care | Payer: 59 | Source: Ambulatory Visit | Attending: Obstetrics & Gynecology | Admitting: Obstetrics & Gynecology

## 2022-06-15 ENCOUNTER — Ambulatory Visit (INDEPENDENT_AMBULATORY_CARE_PROVIDER_SITE_OTHER): Payer: 59 | Admitting: Obstetrics & Gynecology

## 2022-06-15 ENCOUNTER — Encounter (HOSPITAL_BASED_OUTPATIENT_CLINIC_OR_DEPARTMENT_OTHER): Payer: Self-pay | Admitting: *Deleted

## 2022-06-15 VITALS — BP 124/65 | HR 87 | Wt 243.6 lb

## 2022-06-15 DIAGNOSIS — N7011 Chronic salpingitis: Secondary | ICD-10-CM

## 2022-06-15 DIAGNOSIS — O0991 Supervision of high risk pregnancy, unspecified, first trimester: Secondary | ICD-10-CM | POA: Diagnosis not present

## 2022-06-15 DIAGNOSIS — Z3A10 10 weeks gestation of pregnancy: Secondary | ICD-10-CM | POA: Diagnosis not present

## 2022-06-15 DIAGNOSIS — E1165 Type 2 diabetes mellitus with hyperglycemia: Secondary | ICD-10-CM

## 2022-06-15 DIAGNOSIS — O0990 Supervision of high risk pregnancy, unspecified, unspecified trimester: Secondary | ICD-10-CM

## 2022-06-15 DIAGNOSIS — R000 Tachycardia, unspecified: Secondary | ICD-10-CM

## 2022-06-15 LAB — HEPATITIS C ANTIBODY: HCV Ab: NEGATIVE

## 2022-06-15 NOTE — Progress Notes (Signed)
New OB Intake  I explained I am completing New OB Intake today. We discussed EDD of 01/10/2023, by Last Menstrual Period. Pt is G2P0010. I reviewed her allergies, medications and Medical/Surgical/OB history.    Patient Active Problem List   Diagnosis Date Noted   Supervision of high risk pregnancy, antepartum 06/16/2022   Tachycardia 02/09/2015   Social anxiety disorder 08/18/2014   PCOS (polycystic ovarian syndrome)    Type 2 diabetes mellitus with hyperglycemia (HCC) 11/13/2013    Concerns addressed today  Delivery Plans Plans to deliver at [facility]. Discussed the nature of our practice with multiple providers including residents and students. Due to the size of the practice, the delivering provider may not be the same as those providing prenatal care.   Patient is not interested in water birth. Offered upcoming OB visit with CNM to discuss further.  MyChart/Babyscripts MyChart access verified. I explained pt will have some visits in office and some virtually. Babyscripts instructions given and order placed.   Blood Pressure Cuff/Weight Scale Patient has private insurance; instructed to purchase blood pressure cuff and bring to first prenatal appt. Explained after first prenatal appt pt will check weekly and document in Babyscripts. Patient does not have weight scale; patient may purchase if they desire to track weight weekly in Babyscripts.  Anatomy US Explained first scheduled [place] will be around 19 weeks. Anatomy US scheduled for 08/18/22 at 1445.  Is patient a CenteringPregnancy candidate?  Declined Declined due to Group setting Not a candidate due to DM If accepted,    Is patient a Mom+Baby Combined Care candidate?  Declined    Interested in [place]? no  First visit review I reviewed new OB appt with patient. Explained pt will be seen by Dr. Hyacinth Meeker at first visit. Discussed Avelina Laine genetic screening with patient. Pt would like both Panorama and Horizon.. Routine prenatal labs  needed at new OB visit.    Last Pap Diagnosis  Date Value Ref Range Status  04/11/2022 (A)  Final   - Atypical squamous cells of undetermined significance (ASC-US)    Harrie Jeans, RN 06/16/2022  2:36 PM

## 2022-06-15 NOTE — Progress Notes (Signed)
History:   [name] is a 22 y.o. G2P0010 at [redacted]w[redacted]d by LMP being seen today for her first obstetrical visit.  Her obstetrical history is significant for  h/o type 2 diabetes.  HbA1C was 5.3  on 4/19.  She is randomly checking blood sugars and seeing some elevations.  She will start checking fasting and and 2hr pp and she will send me values on Monday.  If elevated, will start insulin.  Patient does intend to breast feed. Pregnancy history fully reviewed.  Patient reports nausea that has improved.  Denies vaginal bleeding.       HISTORY: OB History  Gravida Para Term Preterm AB Living  2 0 0 0 1 0  SAB IAB Ectopic Multiple Live Births  1 0 0 0 0    # Outcome Date GA Lbr Len/2nd Weight Sex Delivery Anes PTL Lv  2 Current           1 SAB             Last pap smear was done 04/11/2022 and was  ASCUS with neg HR HPV  Past Medical History:  Diagnosis Date   Acquired acanthosis nigricans    Anxiety    Bipolar affective (HCC)    with depression and anxiety.   Chronic headaches 09/10/2013   Constipation 11/13/2014   Deliberate self-cutting    last done 1 month ago to left arm   Depression    Diabetes mellitus without complication (HCC)    Type II   Dyspepsia    Episodic mood disorder (HCC) 07/12/2013   Goiter    Menorrhagia    required transfusion in 04/2013   Morbid obesity (HCC) 04/30/2013   Non-alcoholic fatty liver disease 11/13/2013   Obesity    Polycystic ovary disease    Reflux    Tachycardia    Vitamin D deficiency 11/13/2013   Past Surgical History:  Procedure Laterality Date   TONSILLECTOMY Bilateral 2018   TYMPANOSTOMY TUBE PLACEMENT     Family History  Problem Relation Age of Onset   Diabetes Mother    Diabetes Father    Bipolar disorder Father    Heart disease Father 34       AMI   Heart attack Father    Diabetes Maternal Aunt    Diabetes Maternal Uncle    Heart disease Maternal Uncle    Cancer Paternal Aunt        lung metastatic to breat and  liver   Cancer Maternal Grandmother 68       lung cancer   Hypertension Maternal Grandmother    Hypertension Maternal Grandfather    Diabetes Maternal Grandfather    Heart disease Maternal Grandfather    Schizophrenia Paternal Grandmother    Social History   Tobacco Use   Smoking status: Never   Smokeless tobacco: Never  Vaping Use   Vaping Use: Former   Quit date: 04/09/2022  Substance Use Topics   Alcohol use: No    Alcohol/week: 0.0 standard drinks of alcohol   Drug use: No   No Known Allergies Current Outpatient Medications on File Prior to Visit  Medication Sig Dispense Refill   clindamycin (CLEOCIN T) 1 % external solution Apply topically 2 (two) times daily. 30 mL 1   No current facility-administered medications on file prior to visit.    Review of Systems Pertinent items noted in HPI and remainder of comprehensive ROS otherwise negative.  Physical Exam:   Vitals:   06/15/22  1541  BP: 124/65  Pulse: 87  Weight: 243 lb 9.6 oz (110.5 kg)   Fetal Heart Rate (bpm): 167 (bedside u/s)  General: well-developed, well-nourished female in no acute distress  Breasts:  deferred  Skin: normal coloration and turgor, no rashes  Neurologic: oriented, normal, negative, normal mood  Extremities: normal strength, tone, and muscle mass, ROM of all joints is normal  HEENT PERRLA, extraocular movement intact and sclera clear, anicteric  Neck supple and no masses  Cardiovascular: regular rate and rhythm  Respiratory:  no respiratory distress, normal breath sounds  Abdomen: soft, non-tender; bowel sounds normal; no masses,  no organomegaly  Pelvic: deferred    Assessment:    Pregnancy: G2P0010 Patient Active Problem List   Diagnosis Date Noted   Supervision of high risk pregnancy, antepartum 06/16/2022   Tachycardia 02/09/2015   Social anxiety disorder 08/18/2014   PCOS (polycystic ovarian syndrome)    Type 2 diabetes mellitus with hyperglycemia (HCC) 11/13/2013      Plan:    1. Supervision of high risk pregnancy in first trimester - ABO/Rh - Antibody screen - CBC - Hepatitis B surface antigen - HIV Antibody (routine testing w rflx) - HIV (Save tube for possible reflex) - RPR - Rubella screen - Hepatitis C antibody - Urine Culture - Cervicovaginal ancillary only( [place]) - HORIZON Basic Panel - PANORAMA PRENATAL TEST - [place] MFM OB DETAIL +14 WK; Future  2. [redacted] weeks gestation of pregnancy - on PNV - will start baby ASA after 13 weeks  3. Morbid obesity (HCC)  4. Type 2 diabetes mellitus with hyperglycemia, without long-term current use of insulin (HCC) - pt is going to check fasting and pp BS and send results next week.  May need insulin and diabetic education with pregnancy.  Will await the blood sugar levels before making a determination.  5. Hydrosalpinx - discussed with pt.  Will follow with pregnancy  Initial labs drawn. Continue prenatal vitamins. Problem list reviewed and updated. Genetic Screening discussed, NIPS: ordered. Ultrasound discussed; fetal anatomic survey: ordered. Anticipatory guidance about prenatal visits given including labs, ultrasounds, and testing. Discussed usage of Babyscripts and virtual visits as additional source of managing and completing prenatal visits in midst of coronavirus and pandemic.   Encouraged to complete MyChart Registration for her ability to review results, send requests, and have questions addressed.  The nature of [place] [facility]Faculty Practice with multiple MDs and Advanced Practice Providers was explained to patient; also emphasized that residents, students are part of our team. Routine obstetric precautions reviewed. Encouraged to seek out care at office or emergency room [facility] preferred) for urgent and/or emergent concerns. Return in about 4 weeks (around 07/13/2022).     Lum Keas, MD, FACOG Obstetrician & Gynecologist, [facility] for [facility], [facility]

## 2022-06-16 ENCOUNTER — Encounter (HOSPITAL_BASED_OUTPATIENT_CLINIC_OR_DEPARTMENT_OTHER): Payer: Self-pay | Admitting: *Deleted

## 2022-06-16 DIAGNOSIS — O0990 Supervision of high risk pregnancy, unspecified, unspecified trimester: Secondary | ICD-10-CM | POA: Insufficient documentation

## 2022-06-16 LAB — CERVICOVAGINAL ANCILLARY ONLY
Chlamydia: NEGATIVE
Comment: NEGATIVE
Comment: NORMAL
Neisseria Gonorrhea: NEGATIVE

## 2022-06-17 LAB — URINE CULTURE: Organism ID, Bacteria: NO GROWTH

## 2022-06-18 ENCOUNTER — Encounter (HOSPITAL_BASED_OUTPATIENT_CLINIC_OR_DEPARTMENT_OTHER): Payer: Self-pay | Admitting: Obstetrics & Gynecology

## 2022-06-18 DIAGNOSIS — N7011 Chronic salpingitis: Secondary | ICD-10-CM | POA: Insufficient documentation

## 2022-06-18 LAB — PROTEIN / CREATININE RATIO, URINE
Creatinine, Urine: 82.8 mg/dL
Protein, Ur: 7.2 mg/dL
Protein/Creat Ratio: 87 mg/g creat (ref 0–200)

## 2022-06-20 ENCOUNTER — Encounter (HOSPITAL_BASED_OUTPATIENT_CLINIC_OR_DEPARTMENT_OTHER): Payer: Self-pay | Admitting: Obstetrics & Gynecology

## 2022-06-20 LAB — CBC
Hematocrit: 36.4 % (ref 34.0–46.6)
Hemoglobin: 12.6 g/dL (ref 11.1–15.9)
MCH: 31.1 pg (ref 26.6–33.0)
MCHC: 34.6 g/dL (ref 31.5–35.7)
MCV: 90 fL (ref 79–97)
Platelets: 308 10*3/uL (ref 150–450)
RBC: 4.05 x10E6/uL (ref 3.77–5.28)
RDW: 12.7 % (ref 11.7–15.4)
WBC: 11.1 10*3/uL — ABNORMAL HIGH (ref 3.4–10.8)

## 2022-06-20 LAB — AB SCR+ANTIBODY ID: Antibody Screen: POSITIVE — AB

## 2022-06-20 LAB — ABO/RH: Rh Factor: POSITIVE

## 2022-06-20 LAB — HIV ANTIBODY (ROUTINE TESTING W REFLEX): HIV Screen 4th Generation wRfx: NONREACTIVE

## 2022-06-20 LAB — RPR: RPR Ser Ql: NONREACTIVE

## 2022-06-20 LAB — HEPATITIS B SURFACE ANTIGEN: Hepatitis B Surface Ag: NEGATIVE

## 2022-06-20 LAB — HEPATITIS C ANTIBODY: Hep C Virus Ab: NONREACTIVE

## 2022-06-20 LAB — RUBELLA SCREEN: Rubella Antibodies, IGG: 2.6 index (ref >0.99)

## 2022-06-20 LAB — ANTIBODY SCREEN

## 2022-06-21 ENCOUNTER — Other Ambulatory Visit (HOSPITAL_BASED_OUTPATIENT_CLINIC_OR_DEPARTMENT_OTHER): Payer: Self-pay | Admitting: *Deleted

## 2022-06-21 ENCOUNTER — Other Ambulatory Visit (HOSPITAL_BASED_OUTPATIENT_CLINIC_OR_DEPARTMENT_OTHER): Payer: Self-pay

## 2022-06-21 DIAGNOSIS — O0991 Supervision of high risk pregnancy, unspecified, first trimester: Secondary | ICD-10-CM

## 2022-06-21 DIAGNOSIS — O24111 Pre-existing diabetes mellitus, type 2, in pregnancy, first trimester: Secondary | ICD-10-CM

## 2022-06-21 MED ORDER — "PEN NEEDLES 5/16"" 31G X 8 MM MISC": 1.0000 | Freq: Once | 6 refills | Status: AC

## 2022-06-21 MED ORDER — NOVOLOG MIX 70/30 FLEXPEN (70-30) 100 UNIT/ML ~~LOC~~ SUPN: 18.0000 [IU] | PEN_INJECTOR | Freq: Two times a day (BID) | SUBCUTANEOUS | 11 refills | Status: DC

## 2022-06-25 LAB — PANORAMA PRENATAL TEST FULL PANEL:PANORAMA TEST PLUS 5 ADDITIONAL MICRODELETIONS: FETAL FRACTION: 2.4

## 2022-06-27 ENCOUNTER — Other Ambulatory Visit: Payer: Self-pay

## 2022-06-27 ENCOUNTER — Other Ambulatory Visit (HOSPITAL_BASED_OUTPATIENT_CLINIC_OR_DEPARTMENT_OTHER): Payer: Self-pay

## 2022-06-27 ENCOUNTER — Other Ambulatory Visit (HOSPITAL_BASED_OUTPATIENT_CLINIC_OR_DEPARTMENT_OTHER): Payer: Self-pay | Admitting: Obstetrics & Gynecology

## 2022-06-27 ENCOUNTER — Ambulatory Visit (INDEPENDENT_AMBULATORY_CARE_PROVIDER_SITE_OTHER): Payer: 59 | Admitting: Obstetrics & Gynecology

## 2022-06-27 ENCOUNTER — Encounter (HOSPITAL_BASED_OUTPATIENT_CLINIC_OR_DEPARTMENT_OTHER): Payer: Self-pay | Admitting: Obstetrics & Gynecology

## 2022-06-27 VITALS — BP 118/51 | HR 73 | Wt 244.8 lb

## 2022-06-27 DIAGNOSIS — O0991 Supervision of high risk pregnancy, unspecified, first trimester: Secondary | ICD-10-CM

## 2022-06-27 DIAGNOSIS — O0990 Supervision of high risk pregnancy, unspecified, unspecified trimester: Secondary | ICD-10-CM

## 2022-06-27 DIAGNOSIS — F4010 Social phobia, unspecified: Secondary | ICD-10-CM

## 2022-06-27 DIAGNOSIS — Z363 Encounter for antenatal screening for malformations: Secondary | ICD-10-CM

## 2022-06-27 DIAGNOSIS — Z3A11 11 weeks gestation of pregnancy: Secondary | ICD-10-CM

## 2022-06-27 DIAGNOSIS — E282 Polycystic ovarian syndrome: Secondary | ICD-10-CM

## 2022-06-27 DIAGNOSIS — N7011 Chronic salpingitis: Secondary | ICD-10-CM

## 2022-06-27 DIAGNOSIS — O360911 Maternal care for other rhesus isoimmunization, first trimester, fetus 1: Secondary | ICD-10-CM

## 2022-06-27 DIAGNOSIS — E1165 Type 2 diabetes mellitus with hyperglycemia: Secondary | ICD-10-CM

## 2022-06-27 MED ORDER — NOVOLOG MIX 70/30 FLEXPEN (70-30) 100 UNIT/ML ~~LOC~~ SUPN
PEN_INJECTOR | SUBCUTANEOUS | 11 refills | Status: DC
  Filled 2022-06-27: qty 15, 42d supply, fill #0
  Filled 2022-07-25 – 2022-07-26 (×2): qty 15, 42d supply, fill #1

## 2022-06-28 NOTE — Progress Notes (Signed)
PRENATAL VISIT NOTE  Subjective:  [name] is a 22 y.o. G2P0010 at [redacted]w[redacted]d being seen today for ongoing prenatal care.  She is currently monitored for the following issues for this high-risk pregnancy and has Type 2 diabetes mellitus with hyperglycemia (HCC); PCOS (polycystic ovarian syndrome); Social anxiety disorder; Tachycardia; Supervision of high risk pregnancy, antepartum; and Hydrosalpinx on their problem list.  Patient reports no complaints.  Pt here for teaching regarding insulin as she has not been contact yet by diabetic education.  Teaching for giving insulin reviewed today.  Dosing discussed.  Pt has taken insulin in the past but unsure of amounts she took.  Rx was written last week but she had trouble getting this filled and sent mychart message.  Rx was sent to [facility] pharmacy and pt came here to get it so to no delay starting insulin any longer.  Contractions: Not present. Vag. Bleeding: None.  Movement: Absent. Denies leaking of fluid.   Discussed with pt panorama testing needs to be repeated due to inadequate sample and also reviewed Anti- E antibody results with low titer.  Dr. Judeth Cornfield with MFM recommends repeating before anatomy scan so can plan with AFP testing.  The following portions of the patient's history were reviewed and updated as appropriate: allergies, current medications, past family history, past medical history, past social history, past surgical history and problem list.   Objective:   Vitals:   06/27/22 1634  BP: (!) 118/51  Pulse: 73  Weight: 244 lb 12.8 oz (111 kg)    Fetal Status: Fetal Heart Rate (bpm): 164   Movement: Absent     General:  Alert, oriented and cooperative. Patient is in no acute distress.  Skin: Skin is warm and dry. No rash noted.   Cardiovascular: Normal heart rate noted  Respiratory: Normal respiratory effort, no problems with respiration noted  Abdomen: Soft, gravid, appropriate for gestational age.  Pain/Pressure:  Absent     Pelvic: Cervical exam deferred        Extremities: Normal range of motion.  Edema: None  Mental Status: Normal mood and affect. Normal behavior. Normal judgment and thought content.   Assessment and Plan:  Pregnancy: G2P0010 at [redacted]w[redacted]d 1. Supervision of high risk pregnancy, antepartum - on PNV - baby ASA will be started after 13 weeks  2. Type 2 diabetes mellitus with hyperglycemia, without long-term current use of insulin (HCC) - starting insulin.  Will use NPH/reg split 24 u in Am and 12 u PM.  Pt will start tomorrow.  Understands if feeling abnormal, to take blood sugar and what to do if has low values discussed.    3. PCOS (polycystic ovarian syndrome)  4. Social anxiety disorder  5. Hydrosalpinx - note on viability scan   Preterm labor symptoms and general obstetric precautions including but not limited to vaginal bleeding, contractions, leaking of fluid and fetal movement were reviewed in detail with the patient. Please refer to After Visit Summary for other counseling recommendations.   Return in about 3 weeks (around 07/18/2022) for pt will sent in blood sugars every day or two for insulin adjustment.  Future Appointments  Date Time Provider Department Center  07/20/2022  3:55 PM Jerene Bears, MD [facility]  08/18/2022  2:30 PM [facility] NURSE [facility]  08/18/2022  2:45 PM [facility] US4 [facility]  08/23/2022  3:55 PM Left[place]-Kirby, Wilmer Floor, CNM [facility]  09/20/2022  3:55 PM Jerene Bears, MD [facility]  10/18/2022  4:15  PM Jerene Bears, MD [facility]  11/01/2022  3:35 PM Jerene Bears, MD [facility]  11/15/2022  3:55 PM Jerene Bears, MD [facility]  11/29/2022  3:55 PM Jerene Bears, MD [facility]  12/13/2022  3:55 PM Jerene Bears, MD [facility]  12/20/2022  3:55 PM Jerene Bears, MD [facility]    Jerene Bears, MD

## 2022-06-29 ENCOUNTER — Encounter (HOSPITAL_BASED_OUTPATIENT_CLINIC_OR_DEPARTMENT_OTHER): Payer: Self-pay | Admitting: Obstetrics & Gynecology

## 2022-06-30 LAB — HORIZON CUSTOM: REPORT SUMMARY: NEGATIVE

## 2022-07-02 ENCOUNTER — Encounter: Payer: Self-pay | Admitting: Obstetrics & Gynecology

## 2022-07-04 ENCOUNTER — Telehealth (HOSPITAL_BASED_OUTPATIENT_CLINIC_OR_DEPARTMENT_OTHER): Payer: Self-pay | Admitting: *Deleted

## 2022-07-04 ENCOUNTER — Encounter (HOSPITAL_BASED_OUTPATIENT_CLINIC_OR_DEPARTMENT_OTHER): Payer: Self-pay | Admitting: *Deleted

## 2022-07-04 NOTE — Telephone Encounter (Signed)
Called pt to request an update on her blood sugar levels. Pt will send readings to [place] through [employer].

## 2022-07-04 NOTE — Telephone Encounter (Signed)
-----   Message from Jerene Bears, MD sent at 07/04/2022  7:21 AM EDT ----- Regarding: blood sugars Selena Batten, Pt has not sent [place] any blood sugars since starting insulin.  Can you please reach out to her and see how they are?  I'm sure I'm going to have to add more insulin.  Thanks.  MSM

## 2022-07-04 NOTE — Telephone Encounter (Signed)
DOB verified. Advised pt that Dr. Hyacinth Meeker would like for her to increase her morning insulin from 24 to 28 units and her evening dose from 12 to 16 units. Advised pt to send [place] an update with her readings by Friday.Pt verbalized understanding. Pt also informed of horizon and panorama results.

## 2022-07-08 LAB — COMPREHENSIVE METABOLIC PANEL
ALT: 18 IU/L (ref 0–32)
AST: 18 IU/L (ref 0–40)
Albumin/Globulin Ratio: 1.5
Albumin: 4 g/dL (ref 4.0–5.0)
Alkaline Phosphatase: 76 IU/L (ref 44–121)
BUN/Creatinine Ratio: 18 (ref 9–23)
BUN: 9 mg/dL (ref 6–20)
Bilirubin Total: 0.2 mg/dL (ref 0.0–1.2)
CO2: 16 mmol/L — ABNORMAL LOW (ref 20–29)
Calcium: 9.2 mg/dL (ref 8.7–10.2)
Chloride: 99 mmol/L (ref 96–106)
Creatinine, Ser: 0.49 mg/dL — ABNORMAL LOW (ref 0.57–1.00)
Globulin, Total: 2.7 g/dL (ref 1.5–4.5)
Glucose: 183 mg/dL — ABNORMAL HIGH (ref 70–99)
Potassium: 4 mmol/L (ref 3.5–5.2)
Sodium: 133 mmol/L — ABNORMAL LOW (ref 134–144)
Total Protein: 6.7 g/dL (ref 6.0–8.5)
eGFR: 135 mL/min/{1.73_m2} (ref >59)

## 2022-07-08 LAB — SPECIMEN STATUS REPORT

## 2022-07-15 NOTE — Telephone Encounter (Signed)
Called pt to let her know that Dr. Hyacinth Meeker recommends she increase her insulin dosing by 4 units in the evening and taking 15 min before her meal. Pt confirms that she is currently taking 36 units in the morning an will increase to 28 units in the evening. Pt is unable to attend a sooner diabetes education class due to her work schedule. Pt has not started taking aspirin 81 mg but will do that today.

## 2022-07-20 ENCOUNTER — Ambulatory Visit (INDEPENDENT_AMBULATORY_CARE_PROVIDER_SITE_OTHER): Payer: 59 | Admitting: Obstetrics & Gynecology

## 2022-07-20 VITALS — BP 124/58 | HR 77 | Wt 248.4 lb

## 2022-07-20 DIAGNOSIS — L732 Hidradenitis suppurativa: Secondary | ICD-10-CM

## 2022-07-20 DIAGNOSIS — O0990 Supervision of high risk pregnancy, unspecified, unspecified trimester: Secondary | ICD-10-CM

## 2022-07-20 DIAGNOSIS — R799 Abnormal finding of blood chemistry, unspecified: Secondary | ICD-10-CM

## 2022-07-20 DIAGNOSIS — F5081 Binge eating disorder: Secondary | ICD-10-CM

## 2022-07-20 DIAGNOSIS — R768 Other specified abnormal immunological findings in serum: Secondary | ICD-10-CM

## 2022-07-20 DIAGNOSIS — R000 Tachycardia, unspecified: Secondary | ICD-10-CM

## 2022-07-20 DIAGNOSIS — E282 Polycystic ovarian syndrome: Secondary | ICD-10-CM

## 2022-07-20 DIAGNOSIS — E1165 Type 2 diabetes mellitus with hyperglycemia: Secondary | ICD-10-CM

## 2022-07-20 DIAGNOSIS — Z3A15 15 weeks gestation of pregnancy: Secondary | ICD-10-CM

## 2022-07-22 ENCOUNTER — Encounter: Payer: 59 | Attending: Obstetrics & Gynecology | Admitting: Dietician

## 2022-07-22 ENCOUNTER — Other Ambulatory Visit (HOSPITAL_BASED_OUTPATIENT_CLINIC_OR_DEPARTMENT_OTHER): Payer: Self-pay | Admitting: Obstetrics & Gynecology

## 2022-07-22 DIAGNOSIS — E1165 Type 2 diabetes mellitus with hyperglycemia: Secondary | ICD-10-CM | POA: Insufficient documentation

## 2022-07-22 DIAGNOSIS — F5081 Binge eating disorder: Secondary | ICD-10-CM

## 2022-07-22 DIAGNOSIS — Z3A15 15 weeks gestation of pregnancy: Secondary | ICD-10-CM

## 2022-07-22 MED ORDER — OMNIPOD 5 G7 INTRO (GEN 5) KIT: 1.0000 | PACK | 0 refills | Status: DC | PRN

## 2022-07-22 MED ORDER — OMNIPOD DASH PODS (GEN 4) MISC: 6 refills | Status: DC

## 2022-07-22 MED ORDER — OMNIPOD DASH INTRO (GEN 4) KIT: 1.0000 | PACK | Freq: Once | 0 refills | Status: AC

## 2022-07-22 MED ORDER — DEXCOM G7 SENSOR MISC: 6 refills | Status: DC

## 2022-07-22 MED ORDER — CEPHALEXIN 500 MG PO CAPS: 500.0000 mg | ORAL_CAPSULE | Freq: Four times a day (QID) | ORAL | 0 refills | Status: AC

## 2022-07-22 NOTE — Progress Notes (Signed)
Dexcom G7 Personal CGM Training  Start time:1430    End time: 1500 Total time: 30 minues  [name] was educated about the following:  -Getting to know device    (Phone programmed ) -Setting up device (high alert  250  , low alert 70  ) -Inserting sensor (  left upper arm     WNL) -Ending sensor session -Trouble shooting -Tape guide, clarity information  -Reviewed insulin dosing from dexcom.   Patient has [facility] tech support and my contact information.

## 2022-07-24 ENCOUNTER — Other Ambulatory Visit (HOSPITAL_BASED_OUTPATIENT_CLINIC_OR_DEPARTMENT_OTHER): Payer: Self-pay | Admitting: Obstetrics & Gynecology

## 2022-07-24 DIAGNOSIS — R799 Abnormal finding of blood chemistry, unspecified: Secondary | ICD-10-CM | POA: Insufficient documentation

## 2022-07-24 DIAGNOSIS — L732 Hidradenitis suppurativa: Secondary | ICD-10-CM | POA: Insufficient documentation

## 2022-07-24 DIAGNOSIS — F50819 Binge eating disorder, unspecified: Secondary | ICD-10-CM | POA: Insufficient documentation

## 2022-07-24 DIAGNOSIS — F5081 Binge eating disorder: Secondary | ICD-10-CM | POA: Insufficient documentation

## 2022-07-24 HISTORY — DX: Hidradenitis suppurativa: L73.2

## 2022-07-24 MED ORDER — ASPIRIN 81 MG PO TBEC: 81.0000 mg | DELAYED_RELEASE_TABLET | Freq: Every day | ORAL | Status: DC

## 2022-07-24 NOTE — Progress Notes (Addendum)
PRENATAL VISIT NOTE  Subjective:  [name] is a 22 y.o. G2P0010 at [redacted]w[redacted]d being seen today for ongoing prenatal care.  She is currently monitored for the following issues for this high-risk pregnancy and has Type 2 diabetes mellitus with hyperglycemia (HCC); PCOS (polycystic ovarian syndrome); Social anxiety disorder; Tachycardia; Supervision of high risk pregnancy, antepartum; Hydrosalpinx; Binge eating disorder; Abnormal blood chemistry test; and Hidradenitis on their problem list.  She is accompanied by her mother today.  She is using NPH/reg AM and PM dosages.  Blood sugars are not well controlled but they are all over the place.  She admits today that she has and eating disorder including binge eating.  Sometimes she does purge because of the amount of food but will then eat more.  She says she does not feel she can keep a food diary.  States the only thing that has really helped her is when she was on [place].  It took away any appetite so she just didn't eat.  Feels this is why she was so successful with this.    Has not had diabetic education yet.  We discussed the importance of being able to mange blood sugars.  We discussed switching to long acting insulin in evening and then carb counting with meals.  She is concerned she will not be able to count carbs well considering eating.  Insulin pump also discussed as well as using a continuous monitor.  We did discuss this earlier in the pregnancy but she did not want this.  She is willing to try now.  She will see Oran Rein on Friday for teaching.  Will be using a Dexcom 7.  Importance of therapy discussed.  She is willing to proceed with this.   Contractions: Not present. Vag. Bleeding: None.  Movement: Absent. Denies leaking of fluid.   The following portions of the patient's history were reviewed and updated as appropriate: allergies, current medications, past family history, past medical history, past social history, past surgical  history and problem list.   Objective:   Vitals:   07/20/22 1607  BP: (!) 124/58  Pulse: 77  Weight: 248 lb 6.4 oz (112.7 kg)    Fetal Status: Fetal Heart Rate (bpm): 144   Movement: Absent     General:  Alert, oriented and cooperative. Patient is in no acute distress.  Skin: Skin is warm and dry. No rash noted.   Cardiovascular: Normal heart rate noted  Respiratory: Normal respiratory effort, no problems with respiration noted  Abdomen: Soft, gravid, appropriate for gestational age.  Pain/Pressure: Present     Pelvic: Cervical exam deferred        Extremities: Normal range of motion.  Edema: Trace (Feet)  Mental Status: Normal mood and affect. Normal behavior. Normal judgment and thought content.   Assessment and Plan:  Pregnancy: G2P0010 at [redacted]w[redacted]d 1. Supervision of high risk pregnancy, antepartum - on PNV - baby ASA recommended  2. [redacted] weeks gestation of pregnancy - has anatomy scan scheduled  3. Type 2 diabetes mellitus with hyperglycemia, without long term insulin use (HCC) - currently on insulin NPH/reg AM/PM regimen.  Pt sending in blood sugars every 3 days and insulin being adjusted - Will start using Dexcom 7 for blood sugar monitoring and insulin pump will be ordered with teaching by Oran Rein - fetal echo will be needed around 24 weeks - growth scans and fetal monitoring discussed as well  4. PCOS (polycystic ovarian syndrome)  5. Binge eating  disorder - referral to therapy placed  6. 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  She will have consult when have anatomy scan.  Repeat testing has been recommended by MFM  7.  Tachycardia - on metoprolol XL 50mg  daily   Preterm labor symptoms and general obstetric precautions including but not limited to vaginal bleeding, contractions, leaking of fluid and fetal movement were reviewed in detail with the patient. Please refer to After Visit Summary for other counseling recommendations.   Return in  about 4 weeks (around 08/17/2022).  Future Appointments  Date Time Provider Department Center  08/03/2022  4:45 PM Myles Lipps, RD [facility]  08/11/2022  4:00 PM [facility] OBGYN LAB [facility]  08/18/2022  2:30 PM [facility] NURSE [facility]  08/18/2022  2:45 PM [facility] US4 [facility]  08/23/2022  3:55 PM Leftwich-Kirby, Wilmer Floor, CNM [facility]  09/20/2022  3:55 PM Jerene Bears, MD [facility]  10/18/2022  4:15 PM Jerene Bears, MD [facility]  11/01/2022  3:35 PM Jerene Bears, MD [facility]  11/15/2022  3:55 PM Jerene Bears, MD [facility]  11/29/2022  3:55 PM Jerene Bears, MD [facility]  12/13/2022  3:55 PM Jerene Bears, MD [facility]  12/20/2022  3:55 PM Jerene Bears, MD [facility]    Jerene Bears, MD

## 2022-07-24 NOTE — Addendum Note (Signed)
Addended by: Jerene Bears on: 07/24/2022 08:18 PM   Modules accepted: Orders

## 2022-07-25 ENCOUNTER — Other Ambulatory Visit (HOSPITAL_BASED_OUTPATIENT_CLINIC_OR_DEPARTMENT_OTHER): Payer: Self-pay | Admitting: Obstetrics & Gynecology

## 2022-07-25 ENCOUNTER — Other Ambulatory Visit (HOSPITAL_BASED_OUTPATIENT_CLINIC_OR_DEPARTMENT_OTHER): Payer: Self-pay

## 2022-07-25 DIAGNOSIS — E1165 Type 2 diabetes mellitus with hyperglycemia: Secondary | ICD-10-CM

## 2022-07-25 MED ORDER — OMNIPOD DASH INTRO (GEN 4) KIT: 1.0000 | PACK | Freq: Once | 0 refills | Status: AC

## 2022-07-26 ENCOUNTER — Other Ambulatory Visit (HOSPITAL_BASED_OUTPATIENT_CLINIC_OR_DEPARTMENT_OTHER): Payer: Self-pay | Admitting: *Deleted

## 2022-07-26 ENCOUNTER — Other Ambulatory Visit (HOSPITAL_BASED_OUTPATIENT_CLINIC_OR_DEPARTMENT_OTHER): Payer: Self-pay | Admitting: Obstetrics & Gynecology

## 2022-07-26 ENCOUNTER — Other Ambulatory Visit (HOSPITAL_BASED_OUTPATIENT_CLINIC_OR_DEPARTMENT_OTHER): Payer: Self-pay

## 2022-07-26 MED ORDER — NOVOLOG MIX 70/30 FLEXPEN (70-30) 100 UNIT/ML ~~LOC~~ SUPN
PEN_INJECTOR | SUBCUTANEOUS | 11 refills | Status: DC
  Filled 2022-07-26: qty 15, 18d supply, fill #0

## 2022-07-28 ENCOUNTER — Other Ambulatory Visit (HOSPITAL_BASED_OUTPATIENT_CLINIC_OR_DEPARTMENT_OTHER): Payer: Self-pay | Admitting: Obstetrics & Gynecology

## 2022-07-28 DIAGNOSIS — Z3A16 16 weeks gestation of pregnancy: Secondary | ICD-10-CM

## 2022-07-28 MED ORDER — NOVOLOG MIX 70/30 FLEXPEN (70-30) 100 UNIT/ML ~~LOC~~ SUPN: PEN_INJECTOR | SUBCUTANEOUS | 11 refills | Status: DC

## 2022-07-28 MED ORDER — METFORMIN HCL 500 MG PO TABS
500.0000 mg | ORAL_TABLET | Freq: Two times a day (BID) | ORAL | 1 refills | Status: DC
Start: 2022-07-28 — End: 2022-07-29

## 2022-07-29 ENCOUNTER — Other Ambulatory Visit (HOSPITAL_BASED_OUTPATIENT_CLINIC_OR_DEPARTMENT_OTHER): Payer: Self-pay | Admitting: *Deleted

## 2022-07-29 DIAGNOSIS — Z3A16 16 weeks gestation of pregnancy: Secondary | ICD-10-CM

## 2022-07-29 MED ORDER — METFORMIN HCL 500 MG PO TABS
250.0000 mg | ORAL_TABLET | Freq: Two times a day (BID) | ORAL | 1 refills | Status: DC
Start: 2022-07-29 — End: 2022-08-25

## 2022-07-29 NOTE — Telephone Encounter (Signed)
Called pt in response to [employer]. Pt states that she did have GI issues with low dose of 500mg  of Metformin. Pt is willing to try 250mg  and will notify us if she has the same issue with that dosage.

## 2022-08-01 ENCOUNTER — Telehealth: Payer: Self-pay | Admitting: Dietician

## 2022-08-01 ENCOUNTER — Other Ambulatory Visit: Payer: Self-pay | Admitting: Medical

## 2022-08-01 ENCOUNTER — Other Ambulatory Visit (HOSPITAL_BASED_OUTPATIENT_CLINIC_OR_DEPARTMENT_OTHER): Payer: Self-pay | Admitting: *Deleted

## 2022-08-01 NOTE — Telephone Encounter (Signed)
Called patient to find status of her insulin pump.  She states that she got a text today from [facility] stating that they need more information from her doctor.  I discussed to respond to the text when she receives a text stating that it is ready for delivery.   She is to call when she receives the pump and we will arrange an appointment at that time for training.  Oran Rein, RD, LDN, CDCES

## 2022-08-03 ENCOUNTER — Encounter: Payer: Self-pay | Admitting: Skilled Nursing Facility1

## 2022-08-03 ENCOUNTER — Encounter: Payer: 59 | Admitting: Skilled Nursing Facility1

## 2022-08-03 ENCOUNTER — Ambulatory Visit: Payer: 59

## 2022-08-03 VITALS — Ht 65.0 in | Wt 250.8 lb

## 2022-08-03 DIAGNOSIS — E1165 Type 2 diabetes mellitus with hyperglycemia: Secondary | ICD-10-CM

## 2022-08-03 NOTE — Progress Notes (Unsigned)
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  Pt states she has had DM since about 22 years old.  Pt states in the last 2 years her blood sugars have been tougher to control.  Pt states she likes the Dexcom okay: 201 at 2:45 (having eaten spaghetti at 12): average glucose 141, 82% in range   (Took her insulin before eating dinner) Ate dinner at 6pm having had only spagetti with meat sauce (drained the fat) and cheese with water at 8pm 175 then 8:30 224 then 9:30 145 then 10pm 198 then 10:30 195, 11 210 then midnight 136 then back up to 204 then down to 148 at 2am then fasting 116  Pt states going from the GLP appetite loss to increased it has been tough to control portions with increased appetite.   Pt states she used to eat breakfast out most days of the week but has cut back on that behavior.  Pt states she is a [employer] eater: stating she does not eat fruits, non starchy vegetables, or whole grains/complex carbohydrates.   Pt states about 3 years ago she had an issue where she felt her throat was physically closing up so she would eat or drink water to the point of throwing up; stating eating in that moment made her feel her throat was not actually closing and would eat or drink so much it would cause her to throw up. Pt states she has not vomited purposefully in the last 3 years and denies feeling guilt or shame after eating.     Physical Activity: ADL's Stress: managed Sleep: waking a few times in the night  24 hr Recall:  First Meal: 4 scrambled  eggs + cheese sometimes with bacon  Snack: nuts  Second meal: salad: cheese, ranch, bacon bits, chicken, sunflower seeds, green leaf Snack: nuts Third meal: spaghetti  Snack:  Beverages: regular ice coffee + half protein shake + sugar free flavoring, water   NUTRITION INTERVENTION  Nutrition education (E-1) on the following topics:   Initial Follow-up  [x]  []  Definition of Diabetes [x]  []  Why dietary management is important in controlling blood glucose [x]  []  Effects each nutrient has on blood glucose levels [x]  []  Simple carbohydrates vs complex carbohydrates [x]  []  Fluid intake [x]  []  Creating a balanced meal plan [x]  []  Carbohydrate counting  [x]  []  When to check blood glucose levels [x]  []  Proper blood glucose monitoring techniques [x]  []  Effect of stress and stress reduction techniques  [x]  []  Exercise effect on blood glucose levels, appropriate exercise during pregnancy [x]  []  Importance of limiting caffeine and abstaining from alcohol and smoking [x]  []  Medications used for blood sugar control during pregnancy [x]  []  Hypoglycemia and rule of 15 [x]  []  Postpartum self care  Additional Education topics: Importance of vegetables To have an overall healthy diet, adult men and women are recommended to consume anywhere from 2-3 cups of vegetables daily. Vegetables provide a wide range of vitamins and minerals such as vitamin A,  vitamin C, potassium, and folic acid. According to the [employer], including fruit and vegetables daily may reduce the risk of cardiovascular disease, certain cancers, and other non-communicable diseases.   Encouraged pt to try different recipes and cooking methods to assist pt in trying new complex carbohydrates and non starchy vegetables. Encouraged pt to start with the vegetables she knows she will eat and eat those daily and then slowly try to build up from there.    Patient instructed to monitor glucose levels: FBS: 60 - ? 95 mg/dL; 2  hour: ? 161 mg/dL  Patient received handouts: Nutrition Diabetes and Pregnancy Carbohydrate Counting List  Patient will be seen for follow-up as needed. To follow up with Oran Rein for Insulin pump education.

## 2022-08-08 ENCOUNTER — Telehealth (HOSPITAL_BASED_OUTPATIENT_CLINIC_OR_DEPARTMENT_OTHER): Payer: Self-pay | Admitting: *Deleted

## 2022-08-08 NOTE — Telephone Encounter (Signed)
Called pt in response to elevated BP reading. Pt rechecked BP while on phone and it is 154/79. No symptoms. Pt provided with appt for BP check.

## 2022-08-11 ENCOUNTER — Ambulatory Visit (INDEPENDENT_AMBULATORY_CARE_PROVIDER_SITE_OTHER): Payer: 59

## 2022-08-11 ENCOUNTER — Encounter (HOSPITAL_BASED_OUTPATIENT_CLINIC_OR_DEPARTMENT_OTHER): Payer: Self-pay

## 2022-08-11 VITALS — BP 129/59 | HR 89 | Wt 251.6 lb

## 2022-08-11 DIAGNOSIS — O0990 Supervision of high risk pregnancy, unspecified, unspecified trimester: Secondary | ICD-10-CM

## 2022-08-11 DIAGNOSIS — Z3A17 17 weeks gestation of pregnancy: Secondary | ICD-10-CM | POA: Diagnosis not present

## 2022-08-11 DIAGNOSIS — O0992 Supervision of high risk pregnancy, unspecified, second trimester: Secondary | ICD-10-CM

## 2022-08-11 DIAGNOSIS — R768 Other specified abnormal immunological findings in serum: Secondary | ICD-10-CM

## 2022-08-12 ENCOUNTER — Other Ambulatory Visit: Payer: Self-pay | Admitting: Medical

## 2022-08-12 ENCOUNTER — Other Ambulatory Visit (HOSPITAL_BASED_OUTPATIENT_CLINIC_OR_DEPARTMENT_OTHER): Payer: Self-pay

## 2022-08-12 ENCOUNTER — Telehealth: Payer: Self-pay | Admitting: Medical

## 2022-08-12 DIAGNOSIS — E1165 Type 2 diabetes mellitus with hyperglycemia: Secondary | ICD-10-CM

## 2022-08-12 MED ORDER — NOVOLOG MIX 70/30 FLEXPEN (70-30) 100 UNIT/ML ~~LOC~~ SUPN
PEN_INJECTOR | SUBCUTANEOUS | 11 refills | Status: DC
Start: 2022-08-12 — End: 2022-08-12

## 2022-08-12 MED ORDER — NOVOLOG MIX 70/30 FLEXPEN (70-30) 100 UNIT/ML ~~LOC~~ SUPN
PEN_INJECTOR | SUBCUTANEOUS | 11 refills | Status: DC
Start: 2022-08-12 — End: 2022-09-21
  Filled 2022-08-12: qty 15, 18d supply, fill #0

## 2022-08-12 NOTE — Progress Notes (Signed)
Patient came in to have blood work done and blood pressure checked.  Baby Heart Rate: 152 +Swelling in the feet -contractions -vaginal bleeding -pain/pressure +baby movement

## 2022-08-12 NOTE — Telephone Encounter (Signed)
Reviewed CBG log in MyChart with Dr. Jolayne Panther. Recommended increasing insulin by 2 units AM and PM. New Rx sent to patient's pharmacy of choice. Called patient and confirmed identity using 2 unique identifiers. Patient advised of change in insulin dose. All questions answered.   Vonzella Nipple, PA-C 08/12/2022 1:51 PM

## 2022-08-15 DIAGNOSIS — O360920 Maternal care for other rhesus isoimmunization, second trimester, not applicable or unspecified: Secondary | ICD-10-CM | POA: Insufficient documentation

## 2022-08-15 DIAGNOSIS — O99210 Obesity complicating pregnancy, unspecified trimester: Secondary | ICD-10-CM | POA: Insufficient documentation

## 2022-08-17 NOTE — Telephone Encounter (Signed)
Called pt to advise that diabetes educator will be reaching out to find out why pump is not covered by insurance. Advised pt to increase PM insulin by 4 units. Pt verbalized understanding.

## 2022-08-18 ENCOUNTER — Telehealth (HOSPITAL_BASED_OUTPATIENT_CLINIC_OR_DEPARTMENT_OTHER): Payer: Self-pay | Admitting: *Deleted

## 2022-08-18 ENCOUNTER — Ambulatory Visit: Payer: 59 | Admitting: *Deleted

## 2022-08-18 ENCOUNTER — Other Ambulatory Visit: Payer: Self-pay | Admitting: *Deleted

## 2022-08-18 ENCOUNTER — Ambulatory Visit: Payer: 59 | Attending: Obstetrics & Gynecology

## 2022-08-18 VITALS — BP 113/51 | HR 85

## 2022-08-18 DIAGNOSIS — E669 Obesity, unspecified: Secondary | ICD-10-CM

## 2022-08-18 DIAGNOSIS — Z3A19 19 weeks gestation of pregnancy: Secondary | ICD-10-CM

## 2022-08-18 DIAGNOSIS — O360920 Maternal care for other rhesus isoimmunization, second trimester, not applicable or unspecified: Secondary | ICD-10-CM

## 2022-08-18 DIAGNOSIS — Z794 Long term (current) use of insulin: Secondary | ICD-10-CM | POA: Diagnosis not present

## 2022-08-18 DIAGNOSIS — O360911 Maternal care for other rhesus isoimmunization, first trimester, fetus 1: Secondary | ICD-10-CM | POA: Diagnosis present

## 2022-08-18 DIAGNOSIS — O0990 Supervision of high risk pregnancy, unspecified, unspecified trimester: Secondary | ICD-10-CM | POA: Diagnosis present

## 2022-08-18 DIAGNOSIS — O99212 Obesity complicating pregnancy, second trimester: Secondary | ICD-10-CM

## 2022-08-18 DIAGNOSIS — O99210 Obesity complicating pregnancy, unspecified trimester: Secondary | ICD-10-CM

## 2022-08-18 DIAGNOSIS — O24112 Pre-existing diabetes mellitus, type 2, in pregnancy, second trimester: Secondary | ICD-10-CM

## 2022-08-18 DIAGNOSIS — Z7984 Long term (current) use of oral hypoglycemic drugs: Secondary | ICD-10-CM

## 2022-08-18 DIAGNOSIS — E1169 Type 2 diabetes mellitus with other specified complication: Secondary | ICD-10-CM

## 2022-08-18 DIAGNOSIS — O0991 Supervision of high risk pregnancy, unspecified, first trimester: Secondary | ICD-10-CM | POA: Insufficient documentation

## 2022-08-18 DIAGNOSIS — Z363 Encounter for antenatal screening for malformations: Secondary | ICD-10-CM | POA: Diagnosis present

## 2022-08-18 DIAGNOSIS — Z362 Encounter for other antenatal screening follow-up: Secondary | ICD-10-CM

## 2022-08-18 NOTE — Telephone Encounter (Signed)
Called pt to let her know that I spoke with [facility] pharmacy and updated insulin pump order to what is covered by insurance. Advised that they said they would ship pump out to her and she should let us know if she has not received it by Monday. Pt verbalized understanding.

## 2022-08-23 ENCOUNTER — Other Ambulatory Visit (HOSPITAL_BASED_OUTPATIENT_CLINIC_OR_DEPARTMENT_OTHER): Payer: Self-pay | Admitting: Obstetrics & Gynecology

## 2022-08-23 ENCOUNTER — Encounter (HOSPITAL_BASED_OUTPATIENT_CLINIC_OR_DEPARTMENT_OTHER): Payer: Self-pay | Admitting: Advanced Practice Midwife

## 2022-08-23 ENCOUNTER — Ambulatory Visit (INDEPENDENT_AMBULATORY_CARE_PROVIDER_SITE_OTHER): Payer: 59 | Admitting: Advanced Practice Midwife

## 2022-08-23 VITALS — BP 125/55 | HR 85 | Wt 251.8 lb

## 2022-08-23 DIAGNOSIS — R000 Tachycardia, unspecified: Secondary | ICD-10-CM

## 2022-08-23 DIAGNOSIS — E1165 Type 2 diabetes mellitus with hyperglycemia: Secondary | ICD-10-CM

## 2022-08-23 DIAGNOSIS — O26812 Pregnancy related exhaustion and fatigue, second trimester: Secondary | ICD-10-CM

## 2022-08-23 DIAGNOSIS — O0992 Supervision of high risk pregnancy, unspecified, second trimester: Secondary | ICD-10-CM

## 2022-08-23 DIAGNOSIS — Z7985 Long-term (current) use of injectable non-insulin antidiabetic drugs: Secondary | ICD-10-CM

## 2022-08-23 DIAGNOSIS — O0990 Supervision of high risk pregnancy, unspecified, unspecified trimester: Secondary | ICD-10-CM

## 2022-08-23 DIAGNOSIS — Z3A20 20 weeks gestation of pregnancy: Secondary | ICD-10-CM

## 2022-08-23 DIAGNOSIS — R768 Other specified abnormal immunological findings in serum: Secondary | ICD-10-CM

## 2022-08-23 MED ORDER — INSULIN ASPART 100 UNIT/ML IJ SOLN
INTRAMUSCULAR | 5 refills | Status: DC
Start: 2022-08-23 — End: 2022-09-21

## 2022-08-23 MED ORDER — METOPROLOL SUCCINATE ER 50 MG PO TB24
50.0000 mg | ORAL_TABLET | Freq: Every day | ORAL | 2 refills | Status: DC
Start: 2022-08-23 — End: 2023-08-07

## 2022-08-23 NOTE — Progress Notes (Signed)
PRENATAL VISIT NOTE  Subjective:  [name] is a 22 y.o. G2P0010 at [redacted]w[redacted]d being seen today for ongoing prenatal care.  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  Patient reports  fatigue, not sleeping well .  Contractions: Not present. Vag. Bleeding: None.  Movement: Present. Denies leaking of fluid.   The following portions of the patient's history were reviewed and updated as appropriate: allergies, current medications, past family history, past medical history, past social history, past surgical history and problem list.   Objective:   Vitals:   08/23/22 1613  BP: (!) 125/55  Pulse: 85  Weight: 251 lb 12.8 oz (114.2 kg)    Fetal Status: Fetal Heart Rate (bpm): 145   Movement: Present     General:  Alert, oriented and cooperative. Patient is in no acute distress.  Skin: Skin is warm and dry. No rash noted.   Cardiovascular: Normal heart rate noted  Respiratory: Normal respiratory effort, no problems with respiration noted  Abdomen: Soft, gravid, appropriate for gestational age.  Pain/Pressure: Present     Pelvic: Cervical exam deferred        Extremities: Normal range of motion.  Edema: None  Mental Status: Normal mood and affect. Normal behavior. Normal judgment and thought content.   Assessment and Plan:  Pregnancy: G2P0010 at [redacted]w[redacted]d 1. Supervision of high risk pregnancy in second trimester --Anticipatory guidance about next visits/weeks of pregnancy given.   2. Type 2 diabetes mellitus with hyperglycemia, without long-term current use of insulin (HCC) --Reviewed glucose log.  Fasting  values all above 95, mostly in 110s.  PP with > 20 % elevated, highest 225, but many values of 80s, 90s.   --Pt just received continuous glucose monitor but appt will be set in 3-5 days to set it up. --Plan to wait and let pt get glucose monitor set up.  This will likely improve glucose values.  Given labile values, I will consult before changing current insulin regimen. --Pt to sent glucose values to me via MyChart  3. Red blood cell antibody positive -Anti E antibody, check monthly, last check 08/11/22  4. [redacted] weeks gestation of pregnancy   5. Fatigue during pregnancy in second trimester --Pt with trouble getting to sleep/waking up during the night  --Normalized sleep patterns in pregnancy as preparation for newborn baby sleep patterns --Recommend good sleep hygiene/routine before bed.  Try warm shower/bath, turn off screens, play relaxing music/sounds.  Benadryl PRN. --F/U in office if worsening    Preterm labor symptoms and general obstetric precautions including but not limited to vaginal bleeding, contractions, leaking of fluid and fetal movement were reviewed in detail with the patient. Please refer to After Visit Summary for other counseling recommendations.   Return in about 4 weeks (around 09/20/2022) for HROB.  Future Appointments  Date Time Provider Department Center  09/20/2022  3:55 PM Hurshel Party, CNM [facility]  09/23/2022  2:15 PM [facility] NURSE [facility]  09/23/2022  2:30 PM [facility] US1 [facility]  10/18/2022  4:15 PM Leftwich-Kirby, Wilmer Floor, CNM [facility]  11/01/2022  3:35 PM Leftwich-Kirby, Wilmer Floor, CNM [facility]  11/15/2022  3:55 PM Jerene Bears, MD [facility]  11/29/2022  3:55 PM Jerene Bears, MD [facility]  12/13/2022  3:55 PM Jerene Bears, MD [facility]  12/20/2022  3:55 PM Jerene Bears, MD [facility]    Sharen Counter, CNM

## 2022-08-24 ENCOUNTER — Encounter: Payer: Self-pay | Admitting: Dietician

## 2022-08-24 ENCOUNTER — Other Ambulatory Visit (HOSPITAL_BASED_OUTPATIENT_CLINIC_OR_DEPARTMENT_OTHER): Payer: Self-pay | Admitting: Obstetrics & Gynecology

## 2022-08-24 ENCOUNTER — Encounter: Payer: 59 | Attending: Obstetrics & Gynecology | Admitting: Dietician

## 2022-08-24 DIAGNOSIS — Z3A16 16 weeks gestation of pregnancy: Secondary | ICD-10-CM

## 2022-08-24 DIAGNOSIS — E1165 Type 2 diabetes mellitus with hyperglycemia: Secondary | ICD-10-CM | POA: Insufficient documentation

## 2022-08-24 NOTE — Telephone Encounter (Signed)
Discussed with patient through MyChart and she said she didn't need the refill on Novolog currently.  We are also trying to get the patient an insulin pump. Thanks Sherrilyn Rist

## 2022-08-24 NOTE — Progress Notes (Signed)
Start time:  1615   End time:  1745  Patient is here today 08/24/2022 for training on the Omnipod DASH Insulin Pump.  It was confirmed that the patient was aware of the following: Blood glucose (BG) testing Treating hypoglycemia Hyperglycemia Carbohydrate counting   (Patient will be using carbohydrate counting with use of this pump.) Sick day management  Patient was instructed on the following: Have the following supplies available to be prepared: Omnipod PDM and pods Insulin and syringe Blood glucose meter, strips, lancets, lancing device Glucose tablets/fast acting source of carbohydrate/Glucagon  Supply Reorder [facility] Customer Care [phone] ext [phone] When to reorder (when last box of pods is opened)  System Overview Communication process/distance Storage guidelines  Pod: Fill port/adhesive/needle cap/pink slide insert/Waterproof  PDM Battery/button layout/wireless updates (software updates)  PDM Settings Pump Therapy Order Form with settings below Basic settings - Personalized lock screen/time/time zone/date/date format Basal settings Max basal 2.6 Basal rates 1.3 Temp basal  decreased 100% for 12 hours due to intake of insulin this am Bolus settings Target Blood glucose  110 Insulin to carb (IC) ratio  1:7 Correction factor 1:27 for BG above 140 mg/dL Minimum blood glucose for bolus calculations 70 mg/dL Reverse correction on Duration of insulin action  4 hours Extended bolus on Max bolus 15 units Patient was able to accurately enter pump settings into PDM.  Pod Activation Change Pod  Room temperature insulin Fill syringe - min/max amounts DO NOT prefill Pod Site selection/rotation & prep Automated cannula insertion - check infusion site/viewing window & pink slide insert Blood glucose reminder 1.5 hours after insertion When to change POD Patient was able to place pod and view insert.  Home Screen Status Bar, Menu Icon,  Notification/Alarms Tabs Dashboard - IOB (if Calculator ON) - Instructed to leave calculator on Basal (Temp Basal if Temp Basal running) Pod Info - View Pod detail Last Bolus, Last BG Bolus button  Menu icon PDM function - Temp Basal, Pod, Enter BG, Suspend Manage Programs & Presets - Basal programs, temp basal presets, bolus presets Food Library ([employer] app, other tools for carbohydrate counting) History - Notifications & Alarms, Insulin & BG History Settings - PDM Device, Pod sites, Reminders, Blood Glucose, Basal & Temp Basal, Bolus About  Advanced Features (to be discussed at a further date based on patient needs) Extended bolus Temp basal rate Additional basal programs Presets - Temp basal/Bolus presets  Troubleshooting Hypoglycemia Sick day management resource Hyperglycemia & Ketones  Notifications & Alarms Custom reminders Pod Expiration alert Low Reservoir alert  Advisory & Hazard Alarms Advisory alarms - intermittent tones - response required Hazard alarm - Continuous vibration, and Tone - urgent attention required - Pod Expired, Empty Reservoir, Occlusion, Pod Error, Auto Off, PDM Error, System Error  [employer] invitation provided Omnipod app(s) Register for [employer] 24/7 Customer Care  [phone] Reviewed User Guide Showed patient Customer's Bill of Rights and Responsibilities and instructed them to review.  Handouts Provided: Meal plan card for carb counting  Patient to contact me via MyChart or phone.  My cell number provided.  I will call patient tonight for follow up.

## 2022-08-25 ENCOUNTER — Other Ambulatory Visit (HOSPITAL_BASED_OUTPATIENT_CLINIC_OR_DEPARTMENT_OTHER): Payer: Self-pay | Admitting: Obstetrics & Gynecology

## 2022-08-25 ENCOUNTER — Encounter (HOSPITAL_BASED_OUTPATIENT_CLINIC_OR_DEPARTMENT_OTHER): Payer: Self-pay | Admitting: *Deleted

## 2022-08-25 ENCOUNTER — Telehealth: Payer: Self-pay | Admitting: Dietician

## 2022-08-25 DIAGNOSIS — Z3A16 16 weeks gestation of pregnancy: Secondary | ICD-10-CM

## 2022-08-25 NOTE — Telephone Encounter (Signed)
Called patient last night and this afternoon for post Omnipod DASH insulin pump training follow up.  Dexcom Clarity reviewed.  [name] is bolusing appropriately before meals.  She is carbohydrate counting and entering the carbs and glucose.  There have been no lows.  Fasting blood glucose below 100.  Post breakfast spiked to 201 but 140 with lunch.  She states that she thinks it is taking longer for her blood glucose to lower after a bolus that with the previous insulin.  May consider turning off the extended bolus.    Will call her again tomorrow for further follow up, evaluation, and consider changes at that time.  Oran Rein, RD, LDN, CDCES

## 2022-08-26 NOTE — Telephone Encounter (Signed)
Called patient to follow up on the Omnipod DASH insulin pump training 08/24/2022.   She has no questions. Dexcom reviewed.  She is spiking to 150-180 after meals. Instructed patient to change the I:C (insulin to carb) ration from 1:7 to 1:6.3.  Patient to call for questions. She also has the 24/7 Omnipod number.  Oran Rein, RD, LDN, CDCES

## 2022-08-29 ENCOUNTER — Telehealth: Payer: Self-pay | Admitting: Dietician

## 2022-08-29 NOTE — Telephone Encounter (Signed)
Patient called.  I have reviewed her Dexcom data in the [facility] Clarity app. Yesterday she ate out for breakfast - pancakes, no syrup and other foods.  Stated that after this, she did not feel well.  She had a headache and moderate ketones.  Blood glucose remained high despite extra boluses.   I increased her basal rate from 1.3 to 1.4. I decreased her insulin to carb ration (I:C) from 1:6.3 to 1:5.7.  She was able to make these changes in her PDM for the pump.  Oran Rein, RD, LDN, CDCES

## 2022-09-07 ENCOUNTER — Telehealth: Payer: Self-pay | Admitting: Dietician

## 2022-09-07 NOTE — Telephone Encounter (Signed)
Dexcom report reviewed and patient called. Overall, sensor readings are much improved except when she eats out.  She has been using extended bolus and discussed that this should be extended over a longer period of time.  She is bolusing frequently to get more blood glucose correction as well.  She is still about 120 when she wakes.  We can make another segment for her basal rate if we continue to see this trend.  I have made an appointment to see her again on 09/15/2022.  Oran Rein, RD, LDN, CDCES

## 2022-09-15 ENCOUNTER — Encounter: Payer: 59 | Attending: Obstetrics & Gynecology | Admitting: Dietician

## 2022-09-15 DIAGNOSIS — Z3A23 23 weeks gestation of pregnancy: Secondary | ICD-10-CM | POA: Diagnosis not present

## 2022-09-15 DIAGNOSIS — O24111 Pre-existing diabetes mellitus, type 2, in pregnancy, first trimester: Secondary | ICD-10-CM | POA: Diagnosis not present

## 2022-09-15 DIAGNOSIS — Z713 Dietary counseling and surveillance: Secondary | ICD-10-CM | POA: Insufficient documentation

## 2022-09-15 DIAGNOSIS — Z9641 Presence of insulin pump (external) (internal): Secondary | ICD-10-CM | POA: Insufficient documentation

## 2022-09-15 DIAGNOSIS — E1165 Type 2 diabetes mellitus with hyperglycemia: Secondary | ICD-10-CM | POA: Diagnosis present

## 2022-09-15 DIAGNOSIS — Z7984 Long term (current) use of oral hypoglycemic drugs: Secondary | ICD-10-CM | POA: Insufficient documentation

## 2022-09-15 NOTE — Progress Notes (Signed)
  Patient was seen for Gestational Diabetes self-management on 09/15/2022 Start time 1250 and End time 1325  Patient is now walking.  Continues to use her CGM and pump consistently.  She is rotating her POD sites. Insulin pump setting changes made today below.  Clinical: [redacted]w[redacted]d gestation Medications: Novolog via pump, Metformin Medical History: PCOS, Type 2 DM, BED Labs: Lab Results  Component Value Date   HGBA1C 5.3 04/22/2022  CGM:  Dexcom G7 Insulin Pump:  Omnipod DASH - rotating pod placement Current pump settings and changes:  Basal rate 1.4 units >> 1.55  Target BG 110  >> 100  Correct above 140 >> 120  I:C 1:5.7  >>5.1  Correction Factor 27  CGM Results from download: 09/15/2022  % Time CGM active:   100 %   (Goal >70%)  Average glucose:   137 mg/dL for 14 days  Glucose management indicator:   6.6 %  Time in range (70-180 mg/dL):   84 %   (Goal >81%)  Time High (181-250 mg/dL):   16 %   (Goal < 19%)  Time Very High (>250 mg/dL):    0 %   (Goal < 5%)  Time Low (54-69 mg/dL):   0 %   (Goal <1%)  Time Very Low (<54 mg/dL):   0 %   (Goal <4%)  %CV (glucose variability)   41 %  (Goal <36%)  Fasting sensor reading about 135 and spiking to 250 at times after meals.  Dietary and Lifestyle History: Patient lives with her husband. 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  Pt states she has had DM since about 23 years old.  Pt states in the last 2 years her blood sugars have been tougher to control.   Physical Activity: walking most days about 15 minutes  (more active on days that she is in the office) Stress: managed Sleep: waking a few times in the night  24 hr Recall:  First Meal: coffee with 1/2 SF protein shake and SF syrup, 4 scrambled eggs with cheese, 3/4 serving  [employer]: occasional nuts  Second meal: sloppy joe on low carb tortilla, cheese Snack: Occasional string cheese, sugar  free jello Third meal:  grilled chicken, mac and cheese  (feeling more nauseas - possibly due to increased stress) Snack:  Beverages: Water, coffee, SF protein shake, water with flavor packets  NUTRITION INTERVENTION  Nutrition education (E-1) on the following topics:   Reviewed balanced meals and suggestion to increase non-starchy vegetables Discussed caffeine intake on blood glucose Reviewed CGM report with patient Insulin pump adjustments made above   Patient instructed to monitor glucose levels: FBS: 60 - <= 95 mg/dL; 2 hour: <= 782 mg/dL  Patient received handouts: Nutrition Diabetes and Pregnancy Carbohydrate Counting List  Continue follow glucose and insulin pump.  Next appointment as needed.

## 2022-09-16 ENCOUNTER — Encounter (HOSPITAL_BASED_OUTPATIENT_CLINIC_OR_DEPARTMENT_OTHER): Payer: Self-pay | Admitting: Obstetrics & Gynecology

## 2022-09-19 ENCOUNTER — Telehealth: Payer: Self-pay | Admitting: Dietician

## 2022-09-19 NOTE — Telephone Encounter (Signed)
Dexcom CGM report reviewed. Patient called.  She states that her last sensor became inaccurate showing false lows during the night.  She confirmed with a finger stick.  This sensor was changed.  Overall, blood glucose control seems improved.  Still high many times with breakfast but not enough consistency for a pump change without risking a low.  We discussed her breakfast in the am.  Eggs, sausage, occasional grits, MVI with 6 grams sugar which she is bolusing for.  Patient to call for questions.  Oran Rein, RD, LDN, CDCES

## 2022-09-20 ENCOUNTER — Ambulatory Visit (HOSPITAL_BASED_OUTPATIENT_CLINIC_OR_DEPARTMENT_OTHER): Payer: 59 | Admitting: Advanced Practice Midwife

## 2022-09-20 VITALS — BP 125/55 | HR 82 | Wt 257.6 lb

## 2022-09-20 DIAGNOSIS — R55 Syncope and collapse: Secondary | ICD-10-CM

## 2022-09-20 DIAGNOSIS — O0990 Supervision of high risk pregnancy, unspecified, unspecified trimester: Secondary | ICD-10-CM

## 2022-09-20 DIAGNOSIS — Z3A24 24 weeks gestation of pregnancy: Secondary | ICD-10-CM

## 2022-09-20 DIAGNOSIS — E1165 Type 2 diabetes mellitus with hyperglycemia: Secondary | ICD-10-CM

## 2022-09-20 DIAGNOSIS — Z794 Long term (current) use of insulin: Secondary | ICD-10-CM

## 2022-09-20 DIAGNOSIS — R000 Tachycardia, unspecified: Secondary | ICD-10-CM

## 2022-09-20 DIAGNOSIS — O0993 Supervision of high risk pregnancy, unspecified, third trimester: Secondary | ICD-10-CM

## 2022-09-20 NOTE — Progress Notes (Signed)
PRENATAL VISIT NOTE  Subjective:  [name] is a 22 y.o. G2P0010 at [redacted]w[redacted]d being seen today for ongoing prenatal care.  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  Patient reports  near syncope episodes .  Contractions: Not present. Vag. Bleeding: None.  Movement: Present. Denies leaking of fluid.   The following portions of the patient's history were reviewed and updated as appropriate: allergies, current medications, past family history, past medical history, past social history, past surgical history and problem list.   Objective:   Vitals:   09/20/22 1551  BP: (!) 125/55  Pulse: 82  Weight: 257 lb 9.6 oz (116.8 kg)    Fetal Status: Fetal Heart Rate (bpm): 142 Fundal Height: 24 cm Movement: Present     General:  Alert, oriented and cooperative. Patient is in no acute distress.  Skin: Skin is warm and dry. No rash noted.   Cardiovascular: Normal heart rate noted  Respiratory: Normal respiratory effort, no problems with respiration noted  Abdomen: Soft, gravid, appropriate for gestational age.  Pain/Pressure: Present     Pelvic: Cervical exam deferred        Extremities: Normal range of motion.  Edema: Trace (Legs)  Mental Status: Normal mood and affect. Normal behavior. Normal judgment and thought content.   Assessment and Plan:  Pregnancy: G2P0010 at [redacted]w[redacted]d 1. Supervision of high risk pregnancy, antepartum --Anticipatory guidance about next visits/weeks of pregnancy given.  --Questions answered about leaking colostrum, etc.    2. Near syncope --Pt with hx tachycardia and postural  hypotension.  In the last week, she has had symptoms of near syncope, which she experienced with the hypotension, but at rest and sitting, not with prolonged standing.   --Pt reports she "zones out for a minute" then catches herself with her head falling toward the table in front of her. Dizziness for 15 minutes following episode that then resolves.  --Blood glucose wnl at time of symptoms, but BP 90s/40s - AMB Referral to Cardio Obstetrics  3. Type 2 diabetes mellitus with hyperglycemia, without long-term current use of insulin (HCC) --Reviewed glucose log. All fasting values elevated, mostly 105-120.  Most PP values elevated, but with lowest 85 and highest 190.  Most values below 150.  Pt met with diabetic education and had adjustments to insulin pump 4-5 days ago. Has follow up planned next week to evaluate, continue to adjust. --Pt to f/u as scheduled, will review glucose log at visit in 2 weeks  4. Tachycardia   5. [redacted] weeks gestation of pregnancy    Preterm labor symptoms and general obstetric precautions including but not limited to vaginal bleeding, contractions, leaking of fluid and fetal movement were reviewed in detail with the patient. Please refer to After Visit Summary for other counseling recommendations.   No follow-ups on file.  Future Appointments  Date Time Provider Department Center  09/23/2022  2:15 PM [facility] NURSE [facility]  09/23/2022  2:30 PM [facility] US1 [facility]  10/03/2022  4:00 PM Oneta Rack, NP [facility] None  10/18/2022  4:15 PM Leftwich-Kirby, Wilmer Floor, CNM [facility]  11/01/2022  3:35 PM Jerene Bears, MD [facility]  11/15/2022  3:55 PM Jerene Bears, MD [facility]  11/29/2022  3:55 PM Jerene Bears, MD [facility]  12/13/2022  3:55 PM Jerene Bears, MD [facility]  12/20/2022  3:55 PM Jerene Bears, MD [facility]    Sharen Counter, CNM

## 2022-09-21 ENCOUNTER — Other Ambulatory Visit (HOSPITAL_BASED_OUTPATIENT_CLINIC_OR_DEPARTMENT_OTHER): Payer: Self-pay

## 2022-09-21 ENCOUNTER — Encounter: Payer: Self-pay | Admitting: *Deleted

## 2022-09-21 DIAGNOSIS — O0990 Supervision of high risk pregnancy, unspecified, unspecified trimester: Secondary | ICD-10-CM

## 2022-09-21 DIAGNOSIS — E1165 Type 2 diabetes mellitus with hyperglycemia: Secondary | ICD-10-CM

## 2022-09-21 MED ORDER — NOVOLOG MIX 70/30 FLEXPEN (70-30) 100 UNIT/ML ~~LOC~~ SUPN
PEN_INJECTOR | SUBCUTANEOUS | 11 refills | Status: DC
Start: 2022-09-21 — End: 2022-11-12
  Filled 2022-09-21 (×2): qty 15, 18d supply, fill #0

## 2022-09-21 MED ORDER — INSULIN ASPART 100 UNIT/ML IJ SOLN
INTRAMUSCULAR | 5 refills | Status: DC
Start: 2022-09-21 — End: 2022-10-20
  Filled 2022-09-21 (×2): qty 30, 37d supply, fill #0
  Filled 2022-09-23: qty 30, 30d supply, fill #0

## 2022-09-21 NOTE — Telephone Encounter (Signed)
Spoke with pt in regards to her Novolog 100 unit/ml she stated that had push back because of the insurance company. I was able to speak with the pharmacy at [facility] and they was able to fill this medication for pt for a 28 day supply at the cost of $45.00. Medication will be ready to be picked up on 09/22/2022 by 12:30pm. Pt is aware of this.

## 2022-09-23 ENCOUNTER — Telehealth: Payer: Self-pay | Admitting: Dietician

## 2022-09-23 ENCOUNTER — Other Ambulatory Visit (HOSPITAL_BASED_OUTPATIENT_CLINIC_OR_DEPARTMENT_OTHER): Payer: Self-pay

## 2022-09-23 ENCOUNTER — Ambulatory Visit: Payer: 59 | Attending: Maternal & Fetal Medicine

## 2022-09-23 ENCOUNTER — Ambulatory Visit: Payer: 59

## 2022-09-23 VITALS — BP 120/61 | HR 83

## 2022-09-23 DIAGNOSIS — Z362 Encounter for other antenatal screening follow-up: Secondary | ICD-10-CM | POA: Diagnosis present

## 2022-09-23 DIAGNOSIS — Z7984 Long term (current) use of oral hypoglycemic drugs: Secondary | ICD-10-CM | POA: Diagnosis not present

## 2022-09-23 DIAGNOSIS — O24112 Pre-existing diabetes mellitus, type 2, in pregnancy, second trimester: Secondary | ICD-10-CM | POA: Diagnosis not present

## 2022-09-23 DIAGNOSIS — O0990 Supervision of high risk pregnancy, unspecified, unspecified trimester: Secondary | ICD-10-CM

## 2022-09-23 DIAGNOSIS — E1169 Type 2 diabetes mellitus with other specified complication: Secondary | ICD-10-CM

## 2022-09-23 DIAGNOSIS — Z794 Long term (current) use of insulin: Secondary | ICD-10-CM

## 2022-09-23 DIAGNOSIS — O99212 Obesity complicating pregnancy, second trimester: Secondary | ICD-10-CM | POA: Insufficient documentation

## 2022-09-23 DIAGNOSIS — Z3A24 24 weeks gestation of pregnancy: Secondary | ICD-10-CM

## 2022-09-23 DIAGNOSIS — O360920 Maternal care for other rhesus isoimmunization, second trimester, not applicable or unspecified: Secondary | ICD-10-CM | POA: Insufficient documentation

## 2022-09-23 DIAGNOSIS — E669 Obesity, unspecified: Secondary | ICD-10-CM

## 2022-09-23 NOTE — Telephone Encounter (Signed)
Patient called to follow up on insulin pump settings and changes. Dexcom Clarity report reviewed. Patient was high to 278 last night.  She stated that she did not know until morning but the canula had come out and the pump was leaking.  (She was not getting insulin during this time.) Fasting glucose has risen again over the past week. Patient was able to make the following changes:  Current pump settings and changes:             Basal rate  1.55 >> 1.7             Target BG 100 >> 90             Correct above 120 >> 95             I:C 1:5.7  >>4.7             Correction Factor 27  Patient to call for any concerns or questions.  Oran Rein, RD, LDN, CDCES

## 2022-09-26 ENCOUNTER — Other Ambulatory Visit (HOSPITAL_BASED_OUTPATIENT_CLINIC_OR_DEPARTMENT_OTHER): Payer: Self-pay

## 2022-09-26 ENCOUNTER — Other Ambulatory Visit: Payer: Self-pay | Admitting: *Deleted

## 2022-09-26 DIAGNOSIS — O24112 Pre-existing diabetes mellitus, type 2, in pregnancy, second trimester: Secondary | ICD-10-CM

## 2022-10-03 ENCOUNTER — Encounter (HOSPITAL_COMMUNITY): Payer: Self-pay | Admitting: Family

## 2022-10-03 ENCOUNTER — Other Ambulatory Visit: Payer: Self-pay

## 2022-10-03 ENCOUNTER — Ambulatory Visit (HOSPITAL_BASED_OUTPATIENT_CLINIC_OR_DEPARTMENT_OTHER): Payer: Self-pay | Admitting: Family

## 2022-10-03 ENCOUNTER — Encounter (HOSPITAL_COMMUNITY): Payer: Self-pay

## 2022-10-03 VITALS — BP 134/79 | HR 88 | Ht 65.0 in | Wt 261.0 lb

## 2022-10-03 DIAGNOSIS — E270 Other adrenocortical overactivity: Secondary | ICD-10-CM | POA: Insufficient documentation

## 2022-10-03 DIAGNOSIS — F3162 Bipolar disorder, current episode mixed, moderate: Secondary | ICD-10-CM | POA: Diagnosis not present

## 2022-10-03 DIAGNOSIS — D696 Thrombocytopenia, unspecified: Secondary | ICD-10-CM | POA: Insufficient documentation

## 2022-10-03 DIAGNOSIS — E119 Type 2 diabetes mellitus without complications: Secondary | ICD-10-CM

## 2022-10-03 DIAGNOSIS — F5081 Binge eating disorder: Secondary | ICD-10-CM | POA: Diagnosis not present

## 2022-10-03 DIAGNOSIS — E1165 Type 2 diabetes mellitus with hyperglycemia: Secondary | ICD-10-CM

## 2022-10-03 NOTE — Telephone Encounter (Signed)
Called pt in response to [employer]. Pt reports some discomfort in her abdomen that started last night. She states that it does not feel like contractions, and is in area slightly above where her uterus would be. She notices the pain more when she is laying down or when she goes from standing to sitting. Advised pt that if the discomfort persists on increases, or if she has any bleeding, she should go to [facility]U for evaluation. Pt denies vaginal bleeding and reports good fetal movement.

## 2022-10-03 NOTE — Progress Notes (Deleted)
Psychiatric Initial Adult Assessment   Patient Identification: [name] MRN:  [id number] Date of Evaluation:  10/03/2022 Referral Source: *** Chief Complaint:  No chief complaint on file.  Visit Diagnosis: No diagnosis found.  History of Present Illness:  ***       During evaluation Elynore N Ziebell is ***(position); ***he/she is alert/oriented x 4; calm/cooperative; and mood congruent with affect.  Patient is speaking in a clear tone at moderate volume, and normal pace; with good eye contact.  ***His/Her thought process is coherent and relevant; There is no indication that ***he/she is currently responding to internal/external stimuli or experiencing delusional thought content.  Patient denies suicidal/self-harm/homicidal ideation, psychosis, and paranoia.  Patient has remained calm throughout assessment and has answered questions appropriately.   Associated Signs/Symptoms: Depression Symptoms:  depressed mood, feelings of worthlessness/guilt, difficulty concentrating, anxiety, (Hypo) Manic Symptoms:  Impulsivity, Anxiety Symptoms:  Excessive Worry, Psychotic Symptoms:  Hallucinations: None PTSD Symptoms: NA  Past Psychiatric History:   Previous Psychotropic Medications: {YES/NO:21197}  Substance Abuse History in the last 12 months:  {yes no:314532}  Consequences of Substance Abuse: {BHH CONSEQUENCES OF SUBSTANCE ABUSE:22880}  Past Medical History:  Past Medical History:  Diagnosis Date  . Acquired acanthosis nigricans   . Anxiety   . Bipolar affective (HCC)    with depression and anxiety.  . Chronic headaches 09/10/2013  . Constipation 11/13/2014  . Deliberate self-cutting    last done 1 month ago to left arm  . Depression   . Diabetes mellitus without complication (HCC)    Type II  . Dyspepsia   . Episodic mood disorder (HCC) 07/12/2013  . Goiter   . Hidradenitis 07/24/2022   Uses topical clindamycin solution  Needed abx (keflex) for furuncle on 7/19     . Menorrhagia    required transfusion in 04/2013  . Morbid obesity (HCC) 04/30/2013  . Non-alcoholic fatty liver disease 11/13/2013  . Obesity   . Polycystic ovary disease   . Reflux   . Tachycardia   . Transfusion history   . Vitamin D deficiency 11/13/2013    Past Surgical History:  Procedure Laterality Date  . TONSILLECTOMY Bilateral 2018  . TYMPANOSTOMY TUBE PLACEMENT      Family Psychiatric History:   Family History:  Family History  Problem Relation Age of Onset  . Diabetes Mother   . Diabetes Father   . Bipolar disorder Father   . Heart disease Father 38       AMI  . Heart attack Father   . Diabetes Maternal Aunt   . Diabetes Maternal Uncle   . Heart disease Maternal Uncle   . Cancer Paternal Aunt        lung metastatic to breat and liver  . Cancer Maternal Grandmother 27       lung cancer  . Hypertension Maternal Grandmother   . Hypertension Maternal Grandfather   . Diabetes Maternal Grandfather   . Heart disease Maternal Grandfather   . Schizophrenia Paternal Grandmother     Social History:   Social History   Socioeconomic History  . Marital status: Married    Spouse name: Not on file  . Number of children: 0  . Years of education: Not on file  . Highest education level: Not on file  Occupational History  . Not on file  Tobacco Use  . Smoking status: Never  . Smokeless tobacco: Never  Vaping Use  . Vaping status: Former  . Quit date: 04/09/2022  Substance and Sexual  Activity  . Alcohol use: No    Alcohol/week: 0.0 standard drinks of alcohol  . Drug use: No  . Sexual activity: Yes    Partners: Male  Other Topics Concern  . Not on file  Social History Narrative   Family history includes mother with a hysterectomy due to bleeding after birth of second child.   Social Determinants of Health   Financial Resource Strain: Medium Risk (06/15/2022)   Overall Financial Resource Strain (CARDIA)   . Difficulty of Paying Living Expenses: Somewhat  hard  Food Insecurity: No Food Insecurity (06/15/2022)   Hunger Vital Sign   . Worried About Programme researcher, broadcasting/film/video in the Last Year: Never true   . Ran Out of Food in the Last Year: Never true  Transportation Needs: No Transportation Needs (06/15/2022)   PRAPARE - Transportation   . Lack of Transportation (Medical): No   . Lack of Transportation (Non-Medical): No  Physical Activity: Inactive (06/15/2022)   Exercise Vital Sign   . Days of Exercise per Week: 0 days   . Minutes of Exercise per Session: 0 min  Stress: No Stress Concern Present (06/15/2022)   [employer] of Occupational Health - Occupational Stress Questionnaire   . Feeling of Stress : Only a little  Social Connections: Moderately Isolated (06/15/2022)   Social Connection and Isolation Panel [NHANES]   . Frequency of Communication with Friends and Family: More than three times a week   . Frequency of Social Gatherings with Friends and Family: Once a week   . Attends Religious Services: Never   . Active Member of Clubs or Organizations: No   . Attends Banker Meetings: Never   . Marital Status: Married    Additional Social History:   Allergies:  No Known Allergies  Metabolic Disorder Labs: Lab Results  Component Value Date   HGBA1C 5.3 04/22/2022   MPG 223 04/11/2014   MPG 169 (H) 03/17/2014   Lab Results  Component Value Date   PROLACTIN 6.4 04/30/2013   Lab Results  Component Value Date   CHOL 191 04/22/2022   TRIG 105 04/22/2022   HDL 43 04/22/2022   CHOLHDL 4.4 04/22/2022   VLDL 33 04/30/2013   LDLCALC 129 (H) 04/22/2022   LDLCALC 93 04/30/2013   Lab Results  Component Value Date   TSH 4.443 04/13/2014    Therapeutic Level Labs: No results found for: "LITHIUM" No results found for: "CBMZ" No results found for: "VALPROATE"  Current Medications: Current Outpatient Medications  Medication Sig Dispense Refill  . aspirin EC 81 MG tablet Take 1 tablet (81 mg total) by mouth daily.  Swallow whole.    . clindamycin (CLEOCIN T) 1 % external solution Apply topically 2 (two) times daily. 30 mL 1  . Continuous Glucose Sensor (DEXCOM G7 SENSOR) MISC Please dispense 30 day supply of dexcom 7 sensors.  3 sensors. 3 each 6  . insulin aspart (NOVOLOG) 100 UNIT/ML injection Use 63 units daily with insulin pump with max daily amount of 80 units daily 30 mL 5  . insulin aspart protamine - aspart (NOVOLOG MIX 70/30 FLEXPEN) (70-30) 100 UNIT/ML FlexPen Inject 46 units before breakfast and 38 units before dinner (Patient not taking: Reported on 09/23/2022) 15 mL 11  . Insulin Disposable Pump (OMNIPOD DASH PODS, GEN 4,) MISC Please dispense 30 day supply of omnipods.  2 boxes of 5 pods. 10 each 6  . metFORMIN (GLUCOPHAGE) 500 MG tablet Take 1 tablet (500 mg total) by mouth  2 (two) times daily with a meal. 90 tablet 1  . metoprolol succinate (TOPROL-XL) 50 MG 24 hr tablet Take 1 tablet (50 mg total) by mouth daily. 90 tablet 2  . Prenatal Vit-Fe Fumarate-FA (MULTIVITAMIN-PRENATAL) 27-0.8 MG TABS tablet Take 1 tablet by mouth daily at 12 noon.     No current facility-administered medications for this visit.    Musculoskeletal: Strength & Muscle Tone: within normal limits Gait & Station: normal Patient leans: N/A  Psychiatric Specialty Exam: Review of Systems  Last menstrual period 04/05/2022.There is no height or weight on file to calculate BMI.  General Appearance: Casual  Eye Contact:  Good  Speech:  Clear and Coherent  Volume:  Normal  Mood:  Anxious  Affect:  Congruent  Thought Process:  Coherent  Orientation:  Full (Time, Place, and Person)  Thought Content:  Logical  Suicidal Thoughts:  No  Homicidal Thoughts:  No  Memory:  Immediate;   Good Recent;   Good  Judgement:  Good  Insight:  Fair  Psychomotor Activity:  Normal  Concentration:  Concentration: Good  Recall:  Good  Fund of Knowledge:Good  Language: Good  Akathisia:  No  Handed:  Right  AIMS (if indicated):   not done  Assets:  Communication Skills Desire for Improvement Resilience Social Support  ADL's:  Intact  Cognition: WNL  Sleep:  Good   Screenings: GAD-7    Flowsheet Row Clinical Support from 06/15/2022 in [facility] for [employer] at [employer]  Total GAD-7 Score 1      PHQ2-9    Flowsheet Row Clinical Support from 06/15/2022 in [facility] for [facility] at [employer] Office Visit from 04/11/2022 in [facility] for [facility] at [place] Office Visit from 01/16/2018 in Primary Care at [facility] Visit from 06/23/2016 in Primary Care at [facility] Visit from 06/06/2016 in Primary Care at [facility] Total Score 0 0 0 0 0  PHQ-9 Total Score 0 -- 0 -- --       Assessment and Plan:   Collaboration of Care: Medication Management AEB ***  Patient/Guardian was advised Release of Information must be obtained prior to any record release in order to collaborate their care with an outside provider. Patient/Guardian was advised if they have not already done so to contact the registration department to sign all necessary forms in order for [place] to release information regarding their care.   Consent: Patient/Guardian gives verbal consent for treatment and assignment of benefits for services provided during this visit. Patient/Guardian expressed understanding and agreed to proceed.   Oneta Rack, NP 9/30/20244:04 PM

## 2022-10-03 NOTE — Progress Notes (Signed)
[name] 22 year old Caucasian female presents to establish care however,  states she was referred due to past history related to " binge eating disorder." she reports she was prescribed Mounjaro however, is currently [redacted] weeks pregnant and has not struggled with concerns related to binge eating since this pregnancy.  She denies suicidal or homicidal ideations.  Denies auditory visual hallucinations.  Denies depression or depressive symptoms.  Declined initiating any antidepressants at this time.  States she will consider following up with therapy services.

## 2022-10-16 ENCOUNTER — Encounter: Payer: Self-pay | Admitting: Obstetrics and Gynecology

## 2022-10-18 ENCOUNTER — Ambulatory Visit (HOSPITAL_BASED_OUTPATIENT_CLINIC_OR_DEPARTMENT_OTHER): Payer: 59 | Admitting: Advanced Practice Midwife

## 2022-10-18 ENCOUNTER — Other Ambulatory Visit (HOSPITAL_COMMUNITY)
Admission: RE | Admit: 2022-10-18 | Discharge: 2022-10-18 | Disposition: A | Discharge status: Home / Self Care | Payer: 59 | Source: Ambulatory Visit | Attending: Obstetrics & Gynecology | Admitting: Obstetrics & Gynecology

## 2022-10-18 VITALS — BP 134/84 | HR 91 | Wt 266.6 lb

## 2022-10-18 DIAGNOSIS — N898 Other specified noninflammatory disorders of vagina: Secondary | ICD-10-CM

## 2022-10-18 DIAGNOSIS — Z0371 Encounter for suspected problem with amniotic cavity and membrane ruled out: Secondary | ICD-10-CM

## 2022-10-18 DIAGNOSIS — R1030 Lower abdominal pain, unspecified: Secondary | ICD-10-CM

## 2022-10-18 DIAGNOSIS — O0990 Supervision of high risk pregnancy, unspecified, unspecified trimester: Secondary | ICD-10-CM | POA: Diagnosis not present

## 2022-10-18 DIAGNOSIS — Z3A28 28 weeks gestation of pregnancy: Secondary | ICD-10-CM

## 2022-10-18 DIAGNOSIS — Z23 Encounter for immunization: Secondary | ICD-10-CM | POA: Diagnosis not present

## 2022-10-18 DIAGNOSIS — E1165 Type 2 diabetes mellitus with hyperglycemia: Secondary | ICD-10-CM

## 2022-10-18 DIAGNOSIS — Z794 Long term (current) use of insulin: Secondary | ICD-10-CM

## 2022-10-18 NOTE — Progress Notes (Signed)
PRENATAL VISIT NOTE  Subjective:  [name] is a 22 y.o. G2P0010 at [redacted]w[redacted]d being seen today for ongoing prenatal care.  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  Patient reports  leaking fluid and abdominal cramping .  Contractions: Not present. Vag. Bleeding: None.  Movement: Present. Denies leaking of fluid.   The following portions of the patient's history were reviewed and updated as appropriate: allergies, current medications, past family history, past medical history, past social history, past surgical history and problem list.   Objective:   Vitals:   10/18/22 1648  BP: 134/84  Pulse: 91  Weight: 266 lb 9.6 oz (120.9 kg)    Fetal Status: Fetal Heart Rate (bpm): 154   Movement: Present     General:  Alert, oriented and cooperative. Patient is in no acute distress.  Skin: Skin is warm and dry. No rash noted.   Cardiovascular: Normal heart rate noted  Respiratory: Normal respiratory effort, no problems with respiration noted  Abdomen: Soft, gravid, appropriate for gestational age.  Pain/Pressure: Present     Pelvic: Cervix visually closed/thick         Extremities: Normal range of motion.  Edema: Trace  Mental Status: Normal mood and affect. Normal behavior. Normal judgment and thought content.   Assessment and Plan:  Pregnancy: G2P0010 at [redacted]w[redacted]d 1. Supervision of high risk pregnancy, antepartum --Anticipatory guidance about next visits/weeks of pregnancy given.  --Follow up today on Anti-E antibody and 28 week  labs  - CBC - RPR - HIV Antibody (routine testing w rflx) - Antibody screen - Antibody identification  2. Vaginal discharge  - Cervicovaginal ancillary only( [facility])  3. Lower abdominal pain --Rest/ice/heat/warm bath/increase PO fluids/Tylenol/pregnancy support belt  - Urine Culture  4. Type 2 diabetes mellitus with hyperglycemia, without long-term current use of insulin (HCC) --Reviewed glucose log, Fasting glucose values all above 95 but overall below 130, 1 outlier at 170.  PP ~ 50% below 120 with most elevated values in 130s and 140s and an outlier of 220.   --Pt works with diabetic educator with weekly phone call to adjust insulin administered with Omnipod pump.  She reports her Rx needs to be changed since the total daily amount of insulin has increased. The current order has a max limit of 80 units daily when her average is 90 units daily currently. --Consult message sent to Dr Hyacinth Meeker and will follow up to adjust order appropriately   5. Encounter for suspected premature rupture of amniotic membranes, with rupture of membranes not found --Leaking fluid with small gushes and wet underwear x 1 week. No pad required for the amount of leaking, no odor, itching, or irritation. --SSE with negative pooling and negative ferning, cervix visually closed and thick --No evidence of PTL today, precautions reviewed --Cervicovaginal swab pending   Preterm labor symptoms and general obstetric precautions including but not limited to vaginal bleeding, contractions, leaking of fluid and fetal movement were reviewed in detail with the patient. Please refer to After Visit Summary for other counseling recommendations.   No follow-ups on file.  Future Appointments  Date Time Provider Department Center  10/26/2022  2:30 PM  [facility] US2 [facility]  11/01/2022  3:35 PM Jerene Bears, MD [facility]  11/11/2022  4:20 PM Thomasene Ripple, DO [facility] None  11/15/2022  3:55 PM Jerene Bears, MD  [facility]  11/29/2022  3:55 PM Jerene Bears, MD [facility]  12/12/2022  2:55 PM Jerene Bears, MD [facility]  12/20/2022  3:55 PM Jerene Bears, MD [facility]    Sharen Counter, CNM

## 2022-10-20 ENCOUNTER — Other Ambulatory Visit (HOSPITAL_BASED_OUTPATIENT_CLINIC_OR_DEPARTMENT_OTHER): Payer: Self-pay | Admitting: Obstetrics & Gynecology

## 2022-10-20 ENCOUNTER — Telehealth: Payer: Self-pay | Admitting: Dietician

## 2022-10-20 DIAGNOSIS — E1165 Type 2 diabetes mellitus with hyperglycemia: Secondary | ICD-10-CM

## 2022-10-20 DIAGNOSIS — O0990 Supervision of high risk pregnancy, unspecified, unspecified trimester: Secondary | ICD-10-CM

## 2022-10-20 LAB — CERVICOVAGINAL ANCILLARY ONLY
Bacterial Vaginitis (gardnerella): NEGATIVE
Candida Glabrata: NEGATIVE
Candida Vaginitis: NEGATIVE
Comment: NEGATIVE
Comment: NEGATIVE
Comment: NEGATIVE

## 2022-10-20 MED ORDER — INSULIN ASPART 100 UNIT/ML IJ SOLN
INTRAMUSCULAR | 5 refills | Status: DC
Start: 2022-10-20 — End: 2022-11-11

## 2022-10-20 NOTE — Telephone Encounter (Signed)
Called patient to make insulin pump adjustments per verbal order from Valentina Shaggy, MD.  [facility] Clarity reviewed.  Patient remains high with meals, particularly with dinner.  She states that dinner is not as strict although she is trying to count carbohydrates accurately.  She is bolusing extra when her blood glucose is high.  Today, patient changed the ICR from 4.7>>4.2.  Patient to call for questions.  Oran Rein, RD, LDN, CDCES

## 2022-10-21 ENCOUNTER — Encounter (HOSPITAL_BASED_OUTPATIENT_CLINIC_OR_DEPARTMENT_OTHER): Payer: Self-pay | Admitting: Obstetrics & Gynecology

## 2022-10-21 LAB — URINE CULTURE: Organism ID, Bacteria: NO GROWTH

## 2022-10-24 LAB — HIV ANTIBODY (ROUTINE TESTING W REFLEX): HIV Screen 4th Generation wRfx: NONREACTIVE

## 2022-10-24 LAB — ANTIBODY IDENTIFICATION

## 2022-10-24 LAB — CBC
Hematocrit: 33.9 % — ABNORMAL LOW (ref 34.0–46.6)
Hemoglobin: 10.8 g/dL — ABNORMAL LOW (ref 11.1–15.9)
MCH: 29.8 pg (ref 26.6–33.0)
MCHC: 31.9 g/dL (ref 31.5–35.7)
MCV: 93 fL (ref 79–97)
Platelets: 274 10*3/uL (ref 150–450)
RBC: 3.63 x10E6/uL — ABNORMAL LOW (ref 3.77–5.28)
RDW: 13.1 % (ref 11.7–15.4)
WBC: 9.5 10*3/uL (ref 3.4–10.8)

## 2022-10-24 LAB — ANTIBODY SCREEN

## 2022-10-24 LAB — RPR: RPR Ser Ql: NONREACTIVE

## 2022-10-24 LAB — AB SCR+ANTIBODY ID: Antibody Screen: POSITIVE — AB

## 2022-10-24 NOTE — Telephone Encounter (Signed)
Called pt in response to [employer]. Pt reports some swelling in her lower left extremity that started on Friday. She reports that it has improved over the weekend. Offered pt appt. Pt report that she has upcoming appt with MFM. Pt will check BP at home and enter into babyscripts as well.

## 2022-10-26 ENCOUNTER — Ambulatory Visit: Payer: 59 | Attending: Maternal & Fetal Medicine

## 2022-10-26 ENCOUNTER — Other Ambulatory Visit: Payer: Self-pay

## 2022-10-26 ENCOUNTER — Other Ambulatory Visit: Payer: Self-pay | Admitting: *Deleted

## 2022-10-26 DIAGNOSIS — O403XX0 Polyhydramnios, third trimester, not applicable or unspecified: Secondary | ICD-10-CM | POA: Diagnosis not present

## 2022-10-26 DIAGNOSIS — Z3A29 29 weeks gestation of pregnancy: Secondary | ICD-10-CM

## 2022-10-26 DIAGNOSIS — O409XX0 Polyhydramnios, unspecified trimester, not applicable or unspecified: Secondary | ICD-10-CM

## 2022-10-26 DIAGNOSIS — O24113 Pre-existing diabetes mellitus, type 2, in pregnancy, third trimester: Secondary | ICD-10-CM

## 2022-10-26 DIAGNOSIS — O99213 Obesity complicating pregnancy, third trimester: Secondary | ICD-10-CM

## 2022-10-26 DIAGNOSIS — O360930 Maternal care for other rhesus isoimmunization, third trimester, not applicable or unspecified: Secondary | ICD-10-CM

## 2022-10-26 DIAGNOSIS — O24112 Pre-existing diabetes mellitus, type 2, in pregnancy, second trimester: Secondary | ICD-10-CM | POA: Insufficient documentation

## 2022-10-26 DIAGNOSIS — O360939 Maternal care for other rhesus isoimmunization, third trimester, other fetus: Secondary | ICD-10-CM

## 2022-10-26 DIAGNOSIS — E1165 Type 2 diabetes mellitus with hyperglycemia: Secondary | ICD-10-CM | POA: Diagnosis not present

## 2022-10-26 DIAGNOSIS — E669 Obesity, unspecified: Secondary | ICD-10-CM

## 2022-10-28 ENCOUNTER — Encounter: Payer: Self-pay | Admitting: Obstetrics & Gynecology

## 2022-10-28 ENCOUNTER — Telehealth (HOSPITAL_BASED_OUTPATIENT_CLINIC_OR_DEPARTMENT_OTHER): Payer: Self-pay | Admitting: *Deleted

## 2022-10-28 NOTE — Telephone Encounter (Signed)
Called pt in response to elevated reading in babyscripts. Pt reports not feeling well this morning before going to work so she checked her BP and it was elevated. Pt is currently at work and feeling better. Pt unable to come into office for BP check today. Pt will check BP once she gets home from work and enter into babyscripts. Pt denies other symptoms and reports good fetal movement. Pt will go to [facility]U for worsening symptoms or increase in BP over the weekend.

## 2022-10-29 ENCOUNTER — Encounter: Payer: Self-pay | Admitting: Obstetrics and Gynecology

## 2022-10-31 ENCOUNTER — Ambulatory Visit (HOSPITAL_BASED_OUTPATIENT_CLINIC_OR_DEPARTMENT_OTHER): Payer: 59 | Admitting: Obstetrics & Gynecology

## 2022-10-31 DIAGNOSIS — R030 Elevated blood-pressure reading, without diagnosis of hypertension: Secondary | ICD-10-CM

## 2022-10-31 DIAGNOSIS — O0990 Supervision of high risk pregnancy, unspecified, unspecified trimester: Secondary | ICD-10-CM

## 2022-10-31 DIAGNOSIS — Z794 Long term (current) use of insulin: Secondary | ICD-10-CM

## 2022-10-31 DIAGNOSIS — Z3A29 29 weeks gestation of pregnancy: Secondary | ICD-10-CM

## 2022-10-31 DIAGNOSIS — F3162 Bipolar disorder, current episode mixed, moderate: Secondary | ICD-10-CM

## 2022-10-31 DIAGNOSIS — Z23 Encounter for immunization: Secondary | ICD-10-CM

## 2022-10-31 DIAGNOSIS — E1165 Type 2 diabetes mellitus with hyperglycemia: Secondary | ICD-10-CM

## 2022-10-31 DIAGNOSIS — O360920 Maternal care for other rhesus isoimmunization, second trimester, not applicable or unspecified: Secondary | ICD-10-CM

## 2022-10-31 DIAGNOSIS — E785 Hyperlipidemia, unspecified: Secondary | ICD-10-CM | POA: Insufficient documentation

## 2022-10-31 NOTE — Progress Notes (Unsigned)
PRENATAL VISIT NOTE  Subjective:  [name] is a 22 y.o. G2P0010 at [redacted]w[redacted]d being seen today for ongoing prenatal care.  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  Patient reports  lots of pressure but no painful contractions .  Contractions: Irregular. Vag. Bleeding: None.  Movement: Present. Denies leaking of fluid.   Discussed ultrasound with MFM.  Due to smaller weight, weekly monitoring and q 3 week growth scans recommended.  Has NST scheduled for Wednesday.  She has not reached out to diabetic educator for adjusting insulin pump.  Does not have good blood sugar control.  Promises to call.  I will also communicate with her for assistance with adjusting insulin pump.  The following portions of the patient's history were reviewed and updated as appropriate: allergies, current medications, past family history, past medical history, past social history, past surgical history and problem list.   Objective:   Vitals:   10/31/22 1534  BP: 135/65  Pulse: 96  Weight: 269 lb 3.2 oz (122.1 kg)    Fetal Status: Fetal Heart Rate (bpm): 145   Movement: Present     General:  Alert, oriented and cooperative. Patient is in no acute distress.  Skin: Skin is warm and dry. No rash noted.   Cardiovascular: Normal heart rate noted  Respiratory: Normal respiratory effort, no problems with respiration noted  Abdomen: Soft, gravid, appropriate for gestational age.  Pain/Pressure: Present     Pelvic: Cervical exam deferred        Extremities: Normal range of motion.   Edema: Trace (feet)  Mental Status: Normal mood and affect. Normal behavior. Normal judgment and thought content.   Assessment and Plan:  Pregnancy: G2P0010 at [redacted]w[redacted]d 1. Supervision of high risk pregnancy, antepartum - on PNV and baby ASA - recheck 1-2 weeks - Flu vaccine trivalent PF, 6mos and older(Flulaval,Afluria,Fluarix,Fluzone)  2. Elevated blood pressure reading - Protein / creatinine ratio, urine - Comprehensive metabolic panel - CBC  3. 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 Type 2 diabetes mellitus with hyperglycemia, with long-term current use of insulin (HCC) - pt will call for adjustments of insulin pump  5. Moderate mixed bipolar I disorder (HCC)  6. [redacted] weeks gestation of pregnancy  Preterm labor symptoms and general obstetric precautions including but not limited to vaginal bleeding, contractions, leaking of fluid and fetal movement were reviewed in detail with the patient. Please refer to After Visit Summary for other counseling recommendations.   Return in about 2 weeks (around 11/14/2022).  Future Appointments  Date Time Provider Department Center  11/02/2022  2:00 PM [facility] NURSE [facility]  11/02/2022  2:15 PM [facility]  11/09/2022  8:30 AM [facility] NURSE [facility]  11/09/2022  8:45 AM [facility] NST [facility]  11/11/2022  4:20 PM Tobb, Kardie, DO [facility] None  11/15/2022  3:55 PM Jerene Bears, MD [facility]  11/16/2022  1:15 PM [facility] NURSE [facility]  11/16/2022  1:30 PM [facility] US1 [facility]  11/29/2022  3:55 PM Jerene Bears, MD [facility]  12/12/2022  2:55 PM Jerene Bears,  MD [facility]  12/20/2022  3:55 PM Jerene Bears, MD [facility]    Jerene Bears, MD

## 2022-10-31 NOTE — Patient Instructions (Signed)

## 2022-11-01 ENCOUNTER — Encounter (HOSPITAL_BASED_OUTPATIENT_CLINIC_OR_DEPARTMENT_OTHER): Payer: Self-pay | Admitting: Obstetrics & Gynecology

## 2022-11-01 ENCOUNTER — Telehealth: Payer: Self-pay | Admitting: Dietician

## 2022-11-01 ENCOUNTER — Encounter (HOSPITAL_BASED_OUTPATIENT_CLINIC_OR_DEPARTMENT_OTHER): Payer: 59 | Admitting: Obstetrics & Gynecology

## 2022-11-01 LAB — PROTEIN / CREATININE RATIO, URINE
Creatinine, Urine: 67 mg/dL
Protein, Ur: 7.3 mg/dL
Protein/Creat Ratio: 109 mg/g{creat} (ref 0–200)

## 2022-11-01 NOTE — Telephone Encounter (Signed)
Called [name] to make insulin pump changes.  CGM reviewed.  She is doing an extra correction at about 2 am which is lowering her blood glucose well many nights but need to try to decrease with the basal rate adjustment.  [name] made the following changes below: Basal rate 1.7>> 1.85 ICR 4.2>>3.8  insulin to carb ratio ISF 27>>25  insulin sensitivity factor or correction factor  Patient to call for further needs.  Oran Rein, RD, LDN, CDCES

## 2022-11-02 ENCOUNTER — Other Ambulatory Visit: Payer: Self-pay

## 2022-11-02 ENCOUNTER — Ambulatory Visit: Payer: 59 | Admitting: *Deleted

## 2022-11-02 ENCOUNTER — Ambulatory Visit: Payer: 59 | Attending: Obstetrics and Gynecology | Admitting: *Deleted

## 2022-11-02 VITALS — BP 126/68 | HR 93

## 2022-11-02 DIAGNOSIS — O24113 Pre-existing diabetes mellitus, type 2, in pregnancy, third trimester: Secondary | ICD-10-CM | POA: Diagnosis not present

## 2022-11-02 DIAGNOSIS — O360930 Maternal care for other rhesus isoimmunization, third trimester, not applicable or unspecified: Secondary | ICD-10-CM | POA: Diagnosis present

## 2022-11-02 DIAGNOSIS — Z3A30 30 weeks gestation of pregnancy: Secondary | ICD-10-CM | POA: Insufficient documentation

## 2022-11-02 DIAGNOSIS — E1169 Type 2 diabetes mellitus with other specified complication: Secondary | ICD-10-CM | POA: Diagnosis not present

## 2022-11-02 DIAGNOSIS — Z794 Long term (current) use of insulin: Secondary | ICD-10-CM | POA: Insufficient documentation

## 2022-11-02 DIAGNOSIS — O99213 Obesity complicating pregnancy, third trimester: Secondary | ICD-10-CM | POA: Diagnosis present

## 2022-11-02 DIAGNOSIS — O0990 Supervision of high risk pregnancy, unspecified, unspecified trimester: Secondary | ICD-10-CM

## 2022-11-02 NOTE — Procedures (Signed)
[name] 06-13-2000 [redacted]w[redacted]d  Fetus A Non-Stress Test Interpretation for 11/02/22-NST only  Indication: Diabetes and M.O.  Fetal Heart Rate A Mode: External Baseline Rate (A): 135 bpm Variability: Moderate Accelerations: 15 x 15 Decelerations: None Multiple birth?: No  Uterine Activity Mode: Toco Contraction Frequency (min): none Resting Tone Palpated: Relaxed  Interpretation (Fetal Testing) Nonstress Test Interpretation: Reactive Comments: Tracing reviewed byDr. Grace Bushy

## 2022-11-05 ENCOUNTER — Encounter (HOSPITAL_BASED_OUTPATIENT_CLINIC_OR_DEPARTMENT_OTHER): Payer: Self-pay | Admitting: Obstetrics & Gynecology

## 2022-11-05 DIAGNOSIS — Z87898 Personal history of other specified conditions: Secondary | ICD-10-CM | POA: Insufficient documentation

## 2022-11-05 DIAGNOSIS — I471 Supraventricular tachycardia, unspecified: Secondary | ICD-10-CM | POA: Insufficient documentation

## 2022-11-05 LAB — COMPREHENSIVE METABOLIC PANEL
ALT: 14 [IU]/L (ref 0–32)
AST: 12 [IU]/L (ref 0–40)
Albumin: 3.7 g/dL — ABNORMAL LOW (ref 4.0–5.0)
Alkaline Phosphatase: 105 [IU]/L (ref 44–121)
BUN/Creatinine Ratio: 18 (ref 9–23)
BUN: 10 mg/dL (ref 6–20)
Bilirubin Total: <0.2 mg/dL (ref 0.0–1.2)
Calcium: 9.3 mg/dL (ref 8.7–10.2)
Chloride: 103 mmol/L (ref 96–106)
Creatinine, Ser: 0.55 mg/dL — ABNORMAL LOW (ref 0.57–1.00)
Globulin, Total: 3.1 g/dL (ref 1.5–4.5)
Glucose: 139 mg/dL — ABNORMAL HIGH (ref 70–99)
Potassium: 3.9 mmol/L (ref 3.5–5.2)
Sodium: 140 mmol/L (ref 134–144)
Total Protein: 6.8 g/dL (ref 6.0–8.5)
eGFR: 131 mL/min/{1.73_m2} (ref >59)

## 2022-11-05 LAB — CBC
Hematocrit: 34.4 % (ref 34.0–46.6)
Hemoglobin: 11.3 g/dL (ref 11.1–15.9)
MCH: 30.6 pg (ref 26.6–33.0)
MCHC: 32.8 g/dL (ref 31.5–35.7)
MCV: 93 fL (ref 79–97)
Platelets: 265 10*3/uL (ref 150–450)
RBC: 3.69 x10E6/uL — ABNORMAL LOW (ref 3.77–5.28)
RDW: 13.6 % (ref 11.7–15.4)
WBC: 11.4 10*3/uL — ABNORMAL HIGH (ref 3.4–10.8)

## 2022-11-08 ENCOUNTER — Telehealth: Payer: Self-pay | Admitting: Dietician

## 2022-11-08 NOTE — Telephone Encounter (Signed)
Patient texted stating sensor readings were high. Dexcom Clarity report was reviewed. Called patient.  She states that she is unable to give the full bolus as it has a max bolus of 15.  The following Omnipod Dash changes were made:   Max basal 2.6>>4 units/hr Max bolus 15>>30 unit Basal rate 1.85>>2.05 units/hr ICR 3.8>>3.4  She states that she has been in more pain as well which could also increase her glucose.  Continue to monitor. Mikaelyn to call for questions.  Oran Rein, RD, LDN, CDCES

## 2022-11-09 ENCOUNTER — Encounter (HOSPITAL_BASED_OUTPATIENT_CLINIC_OR_DEPARTMENT_OTHER): Payer: Self-pay | Admitting: Obstetrics & Gynecology

## 2022-11-09 ENCOUNTER — Other Ambulatory Visit: Payer: Self-pay

## 2022-11-09 ENCOUNTER — Ambulatory Visit: Payer: 59 | Attending: Maternal & Fetal Medicine | Admitting: *Deleted

## 2022-11-09 ENCOUNTER — Ambulatory Visit: Payer: 59 | Admitting: *Deleted

## 2022-11-09 VITALS — BP 131/73 | HR 88

## 2022-11-09 DIAGNOSIS — Z3A31 31 weeks gestation of pregnancy: Secondary | ICD-10-CM | POA: Diagnosis not present

## 2022-11-09 DIAGNOSIS — E1169 Type 2 diabetes mellitus with other specified complication: Secondary | ICD-10-CM

## 2022-11-09 DIAGNOSIS — O99210 Obesity complicating pregnancy, unspecified trimester: Secondary | ICD-10-CM | POA: Diagnosis present

## 2022-11-09 DIAGNOSIS — O99213 Obesity complicating pregnancy, third trimester: Secondary | ICD-10-CM | POA: Diagnosis not present

## 2022-11-09 DIAGNOSIS — O403XX0 Polyhydramnios, third trimester, not applicable or unspecified: Secondary | ICD-10-CM | POA: Diagnosis not present

## 2022-11-09 DIAGNOSIS — O0990 Supervision of high risk pregnancy, unspecified, unspecified trimester: Secondary | ICD-10-CM

## 2022-11-09 DIAGNOSIS — O24113 Pre-existing diabetes mellitus, type 2, in pregnancy, third trimester: Secondary | ICD-10-CM | POA: Diagnosis present

## 2022-11-09 DIAGNOSIS — E669 Obesity, unspecified: Secondary | ICD-10-CM

## 2022-11-09 NOTE — Procedures (Deleted)
[name] 08-27-2000 [redacted]w[redacted]d  Fetus A Non-Stress Test Interpretation for 11/09/22  Indication: Gestational Diabetes medication controlled, Polyhydramnios, and Obesity  Fetal Heart Rate A Mode: External Baseline Rate (A): 140 bpm Variability: Moderate Accelerations: 15 x 15 Decelerations: None Multiple birth?: No  Uterine Activity Mode: Toco Contraction Frequency (min): None Resting Tone Palpated: Relaxed  Interpretation (Fetal Testing) Nonstress Test Interpretation: Reactive Overall Impression: Reassuring for gestational age Comments: Tracing reviewed by Dr. Darra Lis

## 2022-11-09 NOTE — Procedures (Signed)
[name] 2000/01/05 [redacted]w[redacted]d  Fetus A Non-Stress Test Interpretation for 11/09/22  Indication: Diabetes, Polyhydramnios, and Obesity  Fetal Heart Rate A Mode: External Baseline Rate (A): 140 bpm Variability: Moderate Accelerations: 15 x 15 Decelerations: None Multiple birth?: No  Uterine Activity Mode: Toco Contraction Frequency (min): None Resting Tone Palpated: Relaxed  Interpretation (Fetal Testing) Nonstress Test Interpretation: Reactive Overall Impression: Reassuring for gestational age Comments: Tracing reviewed by Dr. Darra Lis

## 2022-11-10 ENCOUNTER — Telehealth: Payer: Self-pay | Admitting: Dietician

## 2022-11-10 NOTE — Telephone Encounter (Signed)
Dexcom Clarity report reviewed.  Sensor readings are still high.  She did have a recorded low of 45 last night but she confirmed with a finger stick and her blood glucose was actually 90.  This could have been a compression low. She states that she has not been sleeping as well which probably has increased her blood glucose.  [name] made the following insulin pump changes:  ICR 3.4>>3 (insulin to carb ratio)  ISF 25>>22 (correction factor)  Currently she is changing her POD every 2 days.  POD prescription changed by MD.  Oran Rein, RD, LDN, CDCES

## 2022-11-11 ENCOUNTER — Ambulatory Visit (INDEPENDENT_AMBULATORY_CARE_PROVIDER_SITE_OTHER): Payer: 59 | Admitting: Cardiology

## 2022-11-11 ENCOUNTER — Ambulatory Visit: Payer: 59 | Attending: Cardiology

## 2022-11-11 ENCOUNTER — Other Ambulatory Visit (HOSPITAL_BASED_OUTPATIENT_CLINIC_OR_DEPARTMENT_OTHER): Payer: Self-pay | Admitting: Obstetrics & Gynecology

## 2022-11-11 ENCOUNTER — Encounter: Payer: Self-pay | Admitting: Cardiology

## 2022-11-11 VITALS — BP 140/80 | Ht 65.0 in | Wt 272.0 lb

## 2022-11-11 DIAGNOSIS — Z3A31 31 weeks gestation of pregnancy: Secondary | ICD-10-CM | POA: Diagnosis not present

## 2022-11-11 DIAGNOSIS — R000 Tachycardia, unspecified: Secondary | ICD-10-CM | POA: Diagnosis not present

## 2022-11-11 DIAGNOSIS — R42 Dizziness and giddiness: Secondary | ICD-10-CM

## 2022-11-11 DIAGNOSIS — I10 Essential (primary) hypertension: Secondary | ICD-10-CM | POA: Diagnosis not present

## 2022-11-11 DIAGNOSIS — E1165 Type 2 diabetes mellitus with hyperglycemia: Secondary | ICD-10-CM

## 2022-11-11 DIAGNOSIS — R55 Syncope and collapse: Secondary | ICD-10-CM

## 2022-11-11 DIAGNOSIS — O0990 Supervision of high risk pregnancy, unspecified, unspecified trimester: Secondary | ICD-10-CM

## 2022-11-11 MED ORDER — OMNIPOD DASH PODS (GEN 4) MISC: 2 refills | Status: DC

## 2022-11-11 MED ORDER — INSULIN ASPART 100 UNIT/ML IJ SOLN: INTRAMUSCULAR | 3 refills | Status: DC

## 2022-11-11 MED ORDER — NIFEDIPINE ER OSMOTIC RELEASE 30 MG PO TB24: 30.0000 mg | ORAL_TABLET | Freq: Every day | ORAL | 0 refills | Status: DC

## 2022-11-11 NOTE — Patient Instructions (Addendum)
Medication Instructions:  Your physician has recommended you make the following change in your medication:  START: Nifedipine 30 mg once daily *If you need a refill on your cardiac medications before your next appointment, please call your pharmacy*   Lab Work: None   Testing/Procedures: Christena Deem- Long Term Monitor Instructions  Your physician has requested you wear a ZIO patch monitor for 14 days.  This is a single patch monitor. [employer] supplies one patch monitor per enrollment. Additional stickers are not available. Please do not apply patch if you will be having a Nuclear Stress Test,  Echocardiogram, Cardiac CT, MRI, or Chest Xray during the period you would be wearing the  monitor. The patch cannot be worn during these tests. You cannot remove and re-apply the  ZIO XT patch monitor.  Your ZIO patch monitor will be mailed 3 day USPS to your address on file. It may take 3-5 days  to receive your monitor after you have been enrolled.  Once you have received your monitor, please review the enclosed instructions. Your monitor  has already been registered assigning a specific monitor serial # to you.  Billing and Patient Assistance Program Information  We have supplied [employer] with any of your insurance information on file for billing purposes. [employer] offers a sliding scale Patient Assistance Program for patients that do not have  insurance, or whose insurance does not completely cover the cost of the ZIO monitor.  You must apply for the Patient Assistance Program to qualify for this discounted rate.  To apply, please call [employer] at [phone], select option 4, select option 2, ask to apply for  Patient Assistance Program. Meredeth Ide will ask your household income, and how many people  are in your household. They will quote your out-of-pocket cost based on that information.  [employer] will also be able to set up a 9-month, interest-free payment plan if needed.  Applying the monitor    Shave hair from upper left chest.  Hold abrader disc by orange tab. Rub abrader in 40 strokes over the upper left chest as  indicated in your monitor instructions.  Clean area with 4 enclosed alcohol pads. Let dry.  Apply patch as indicated in monitor instructions. Patch will be placed under collarbone on left  side of chest with arrow pointing upward.  Rub patch adhesive wings for 2 minutes. Remove white label marked "1". Remove the white  label marked "2". Rub patch adhesive wings for 2 additional minutes.  While looking in a mirror, press and release button in center of patch. A small green light will  flash 3-4 times. This will be your only indicator that the monitor has been turned on.  Do not shower for the first 24 hours. You may shower after the first 24 hours.  Press the button if you feel a symptom. You will hear a small click. Record Date, Time and  Symptom in the Patient Logbook.  When you are ready to remove the patch, follow instructions on the last 2 pages of Patient  Logbook. Stick patch monitor onto the last page of Patient Logbook.  Place Patient Logbook in the blue and white box. Use locking tab on box and tape box closed  securely. The blue and white box has prepaid postage on it. Please place it in the mailbox as  soon as possible. Your physician should have your test results approximately 7 days after the  monitor has been mailed back to [facility].  Call [facility]  at [phone] if you have questions regarding  your ZIO XT patch monitor. Call them immediately if you see an orange light blinking on your  monitor.  If your monitor falls off in less than 4 days, contact our Monitor department at [phone].  If your monitor becomes loose or falls off after 4 days call [employer] at [phone] for  suggestions on securing your monitor      Follow-Up: At [facility], you and your health needs are our priority.  As part of our  continuing mission to provide you with exceptional heart care, we have created designated Provider Care Teams.  These Care Teams include your primary Cardiologist (physician) and Advanced Practice Providers (APPs -  Physician Assistants and Nurse Practitioners) who all work together to provide you with the care you need, when you need it.   Your next appointment:   6 week(s)  Provider:   Thomasene Ripple, DO

## 2022-11-11 NOTE — Progress Notes (Unsigned)
Enrolled patient for a 14 day Zio XT  monitor to be mailed to patients home  °

## 2022-11-12 ENCOUNTER — Encounter: Payer: Self-pay | Admitting: Obstetrics and Gynecology

## 2022-11-12 ENCOUNTER — Other Ambulatory Visit (HOSPITAL_BASED_OUTPATIENT_CLINIC_OR_DEPARTMENT_OTHER): Payer: Self-pay | Admitting: Obstetrics & Gynecology

## 2022-11-12 ENCOUNTER — Other Ambulatory Visit (HOSPITAL_COMMUNITY): Payer: Self-pay

## 2022-11-12 ENCOUNTER — Other Ambulatory Visit (HOSPITAL_BASED_OUTPATIENT_CLINIC_OR_DEPARTMENT_OTHER): Payer: Self-pay

## 2022-11-12 MED ORDER — OMNIPOD DASH PODS (GEN 4) MISC
2 refills | Status: DC
  Filled 2022-11-12 (×2): qty 20, 30d supply, fill #0
  Filled 2022-12-05 – 2022-12-06 (×2): qty 20, 30d supply, fill #1

## 2022-11-13 NOTE — Progress Notes (Signed)
Cardio-Obstetrics Clinic  New Evaluation  Date:  11/13/2022   ID:  [name], DOB [birth year], MRN [id number]  PCP:  Jerene Bears, MD   [facility] Providers Cardiologist:  None  Electrophysiologist:  None       Referring MD: Hurshel Party   Chief Complaint: " I am having palpiations"  History of Present Illness:    [name] is a 22 y.o. female [G2P0010] who is being seen today for the evaluation of palpitations at the request of Leftwich-Kirby, Wilmer Floor,*.    She is currently 31-week pregnant patient with a history of tachycardia, presents with concerns of dizziness and a racing heart. These symptoms have been present before her pregnancy and have been managed with metoprolol, prescribed by her primary care physician. She reports that these symptoms have been occurring more frequently in the past two weeks. She also reports some leg swelling, particularly in her ankles and lower legs. She has not had any episodes of syncope, but she has had instances where she felt out of control of her body. She has been under the care of Dr. Hyacinth Meeker and has an upcoming growth scan for her baby.  Prior CV Studies Reviewed: The following studies were reviewed today: Zio   Past Medical History:  Diagnosis Date   Acquired acanthosis nigricans    Anxiety    Bipolar affective (HCC)    with depression and anxiety.   Chronic headaches 09/10/2013   Constipation 11/13/2014   Deliberate self-cutting    last done 1 month ago to left arm   Depression    Diabetes mellitus without complication (HCC)    Type II   Dyspepsia    Episodic mood disorder (HCC) 07/12/2013   Goiter    Hidradenitis 07/24/2022   Uses topical clindamycin solution  Needed abx (keflex) for furuncle on 7/19     Menorrhagia    required transfusion in 04/2013   Morbid obesity (HCC) 04/30/2013   Non-alcoholic fatty liver disease 11/13/2013   Obesity    Polycystic ovary disease    Reflux     Tachycardia    Transfusion history    Vitamin D deficiency 11/13/2013    Past Surgical History:  Procedure Laterality Date   TONSILLECTOMY Bilateral 2018   TYMPANOSTOMY TUBE PLACEMENT        OB History     Gravida  2   Para      Term      Preterm      AB  1   Living         SAB  1   IAB      Ectopic      Multiple      Live Births                  Current Medications: Current Meds  Medication Sig   aspirin EC 81 MG tablet Take 1 tablet (81 mg total) by mouth daily. Swallow whole.   clindamycin (CLEOCIN T) 1 % external solution Apply topically 2 (two) times daily.   Continuous Glucose Sensor (DEXCOM G7 SENSOR) MISC Please dispense 30 day supply of dexcom 7 sensors.  3 sensors.   insulin aspart (NOVOLOG) 100 UNIT/ML injection Use 120 units daily with insulin pump with max daily amount of 140 units daily   metFORMIN (GLUCOPHAGE) 500 MG tablet Take 1 tablet (500 mg total) by mouth 2 (two) times daily with a meal.   metoprolol succinate (TOPROL-XL) 50 MG  24 hr tablet Take 1 tablet (50 mg total) by mouth daily.   NIFEdipine (PROCARDIA-XL/NIFEDICAL-XL) 30 MG 24 hr tablet Take 1 tablet (30 mg total) by mouth daily.   Prenatal Vit-Fe Fumarate-FA (MULTIVITAMIN-PRENATAL) 27-0.8 MG TABS tablet Take 1 tablet by mouth daily at 12 noon.   [DISCONTINUED] Insulin Disposable Pump (OMNIPOD DASH PODS, GEN 4,) MISC Please dispense 30 day supply of omnipods.  Pt is using between 115 - 140 each day.  Pt currently using up to 21 pods each month.     Allergies:   Patient has no known allergies.   Social History   Socioeconomic History   Marital status: Married    Spouse name: Not on file   Number of children: 0   Years of education: Not on file   Highest education level: Not on file  Occupational History   Not on file  Tobacco Use   Smoking status: Never   Smokeless tobacco: Never  Vaping Use   Vaping status: Former   Quit date: 04/09/2022  Substance and Sexual Activity    Alcohol use: No    Alcohol/week: 0.0 standard drinks of alcohol   Drug use: No   Sexual activity: Yes    Partners: Male  Other Topics Concern   Not on file  Social History Narrative   Family history includes mother with a hysterectomy due to bleeding after birth of second child.   Social Determinants of Health   Financial Resource Strain: Medium Risk (06/15/2022)   Overall Financial Resource Strain (CARDIA)    Difficulty of Paying Living Expenses: Somewhat hard  Food Insecurity: No Food Insecurity (06/15/2022)   Hunger Vital Sign    Worried About Running Out of Food in the Last Year: Never true    Ran Out of Food in the Last Year: Never true  Transportation Needs: No Transportation Needs (06/15/2022)   PRAPARE - Administrator, Civil Service (Medical): No    Lack of Transportation (Non-Medical): No  Physical Activity: Inactive (06/15/2022)   Exercise Vital Sign    Days of Exercise per Week: 0 days    Minutes of Exercise per Session: 0 min  Stress: No Stress Concern Present (06/15/2022)   [employer] of Occupational Health - Occupational Stress Questionnaire    Feeling of Stress : Only a little  Social Connections: Moderately Isolated (06/15/2022)   Social Connection and Isolation Panel [NHANES]    Frequency of Communication with Friends and Family: More than three times a week    Frequency of Social Gatherings with Friends and Family: Once a week    Attends Religious Services: Never    Database administrator or Organizations: No    Attends Engineer, structural: Never    Marital Status: Married      Family History  Problem Relation Age of Onset   Diabetes Mother    Diabetes Father    Bipolar disorder Father    Heart disease Father 51       AMI   Heart attack Father    Diabetes Maternal Aunt    Diabetes Maternal Uncle    Heart disease Maternal Uncle    Cancer Paternal Aunt        lung metastatic to breat and liver   Cancer Maternal  Grandmother 68       lung cancer   Hypertension Maternal Grandmother    Hypertension Maternal Grandfather    Diabetes Maternal Grandfather    Heart disease Maternal Grandfather  Schizophrenia Paternal Grandmother       ROS:   Please see the history of present illness.    Palpitations All other systems reviewed and are negative.   Labs/EKG Reviewed:    EKG:   EKG was ordered today.  The ekg ordered today demonstrates normal sinus rhythm, Hr 90 bpm  Recent Labs: 10/31/2022: ALT 14; BUN 10; Creatinine, Ser 0.55; Hemoglobin 11.3; Platelets 265; Potassium 3.9; Sodium 140   Recent Lipid Panel Lab Results  Component Value Date/Time   CHOL 191 04/22/2022 08:06 AM   TRIG 105 04/22/2022 08:06 AM   HDL 43 04/22/2022 08:06 AM   CHOLHDL 4.4 04/22/2022 08:06 AM   CHOLHDL 4.1 04/30/2013 05:50 PM   LDLCALC 129 (H) 04/22/2022 08:06 AM    Physical Exam:    VS:  BP (!) 140/80 (BP Location: Left Arm)   Ht 5\' 5"  (1.651 m)   Wt 272 lb (123.4 kg)   LMP 04/05/2022 (Exact Date)   SpO2 90%   BMI 45.26 kg/m     Wt Readings from Last 3 Encounters:  11/11/22 272 lb (123.4 kg)  10/31/22 269 lb 3.2 oz (122.1 kg)  10/18/22 266 lb 9.6 oz (120.9 kg)     GEN:  Well nourished, well developed in no acute distress HEENT: Normal NECK: No JVD; No carotid bruits LYMPHATICS: No lymphadenopathy CARDIAC: RRR, no murmurs, rubs, gallops RESPIRATORY:  Clear to auscultation without rales, wheezing or rhonchi  ABDOMEN: Soft, non-tender, non-distended MUSCULOSKELETAL:  No edema; No deformity  SKIN: Warm and dry NEUROLOGIC:  Alert and oriented x 3 PSYCHIATRIC:  Normal affect    Risk Assessment/Risk Calculators:     CARPREG II Risk Prediction Index Score:  1.  The patient's risk for a primary cardiac event is 5%.   Modified World Health Organization [facility]) Classification of Maternal CV Risk   Class I         ASSESSMENT & PLAN:    Hypertension in Pregnancy Blood pressure elevated during  pregnancy. Patient has a history of low blood pressure prior to pregnancy. Currently on Metoprolol for tachycardia. Concern for pre-eclampsia as pregnancy progresses, continue aspirin 81 mg daily -Start Nifedipine 30mg  daily to control blood pressure. -Monitor blood pressure closely to prevent pre-eclampsia.  Tachycardia Patient has a history of tachycardia, currently managed with Metoprolol. Reports episodes of dizziness and feeling "out of it", which have increased in frequency in the past month. Previous cardiac monitoring and echocardiogram were normal. -Order another cardiac monitor to assess for changes during pregnancy. -Consider referral to ENT postpartum if symptoms persist.  Intrauterine Growth Restriction (IUGR) Risk due to hypertension in pregnancy. Patient is under the care of Dr. Hyacinth Meeker who is monitoring for IUGR. -Continue current management plan with Dr. Hyacinth Meeker. -Perform growth scan next week.  Follow-up in 4 weeks with the pharmacist and in 6 weeks with the physician.  Patient Instructions  Medication Instructions:  Your physician has recommended you make the following change in your medication:  START: Nifedipine 30 mg once daily *If you need a refill on your cardiac medications before your next appointment, please call your pharmacy*   Lab Work: None   Testing/Procedures: Christena Deem- Long Term Monitor Instructions  Your physician has requested you wear a ZIO patch monitor for 14 days.  This is a single patch monitor. [employer] supplies one patch monitor per enrollment. Additional stickers are not available. Please do not apply patch if you will be having a Nuclear Stress Test,  Echocardiogram, Cardiac CT, MRI,  or Chest Xray during the period you would be wearing the  monitor. The patch cannot be worn during these tests. You cannot remove and re-apply the  ZIO XT patch monitor.  Your ZIO patch monitor will be mailed 3 day USPS to your address on file. It may take 3-5  days  to receive your monitor after you have been enrolled.  Once you have received your monitor, please review the enclosed instructions. Your monitor  has already been registered assigning a specific monitor serial # to you.  Billing and Patient Assistance Program Information  We have supplied [employer] with any of your insurance information on file for billing purposes. [employer] offers a sliding scale Patient Assistance Program for patients that do not have  insurance, or whose insurance does not completely cover the cost of the ZIO monitor.  You must apply for the Patient Assistance Program to qualify for this discounted rate.  To apply, please call [employer] at [phone], select option 4, select option 2, ask to apply for  Patient Assistance Program. Meredeth Ide will ask your household income, and how many people  are in your household. They will quote your out-of-pocket cost based on that information.  [employer] will also be able to set up a 44-month, interest-free payment plan if needed.  Applying the monitor   Shave hair from upper left chest.  Hold abrader disc by orange tab. Rub abrader in 40 strokes over the upper left chest as  indicated in your monitor instructions.  Clean area with 4 enclosed alcohol pads. Let dry.  Apply patch as indicated in monitor instructions. Patch will be placed under collarbone on left  side of chest with arrow pointing upward.  Rub patch adhesive wings for 2 minutes. Remove white label marked "1". Remove the white  label marked "2". Rub patch adhesive wings for 2 additional minutes.  While looking in a mirror, press and release button in center of patch. A small green light will  flash 3-4 times. This will be your only indicator that the monitor has been turned on.  Do not shower for the first 24 hours. You may shower after the first 24 hours.  Press the button if you feel a symptom. You will hear a small click. Record Date, Time and  Symptom in the  Patient Logbook.  When you are ready to remove the patch, follow instructions on the last 2 pages of Patient  Logbook. Stick patch monitor onto the last page of Patient Logbook.  Place Patient Logbook in the blue and white box. Use locking tab on box and tape box closed  securely. The blue and white box has prepaid postage on it. Please place it in the mailbox as  soon as possible. Your physician should have your test results approximately 7 days after the  monitor has been mailed back to [facility].  Call [facility] Customer Care at [phone] if you have questions regarding  your ZIO XT patch monitor. Call them immediately if you see an orange light blinking on your  monitor.  If your monitor falls off in less than 4 days, contact our Monitor department at [phone].  If your monitor becomes loose or falls off after 4 days call [employer] at [phone] for  suggestions on securing your monitor      Follow-Up: At [facility], you and your health needs are our priority.  As part of our continuing mission to provide you with exceptional heart care, we have created designated Provider  Care Teams.  These Care Teams include your primary Cardiologist (physician) and Advanced Practice Providers (APPs -  Physician Assistants and Nurse Practitioners) who all work together to provide you with the care you need, when you need it.   Your next appointment:   6 week(s)  Provider:   Thomasene Ripple, DO    Dispo:  No follow-ups on file.   Medication Adjustments/Labs and Tests Ordered: Current medicines are reviewed at length with the patient today.  Concerns regarding medicines are outlined above.  Tests Ordered: Orders Placed This Encounter  Procedures   AMB Referral to [facility] Pharm-D   LONG TERM MONITOR (3-14 DAYS)   EKG 12-Lead   Medication Changes: Meds ordered this encounter  Medications   NIFEdipine (PROCARDIA-XL/NIFEDICAL-XL) 30 MG 24 hr tablet    Sig: Take  1 tablet (30 mg total) by mouth daily.    Dispense:  90 tablet    Refill:  0

## 2022-11-14 ENCOUNTER — Other Ambulatory Visit (HOSPITAL_BASED_OUTPATIENT_CLINIC_OR_DEPARTMENT_OTHER): Payer: Self-pay

## 2022-11-14 ENCOUNTER — Ambulatory Visit (INDEPENDENT_AMBULATORY_CARE_PROVIDER_SITE_OTHER): Payer: 59 | Admitting: Obstetrics & Gynecology

## 2022-11-14 ENCOUNTER — Encounter (HOSPITAL_BASED_OUTPATIENT_CLINIC_OR_DEPARTMENT_OTHER): Payer: Self-pay | Admitting: Obstetrics & Gynecology

## 2022-11-14 VITALS — BP 138/86 | HR 117 | Wt 273.0 lb

## 2022-11-14 DIAGNOSIS — O403XX0 Polyhydramnios, third trimester, not applicable or unspecified: Secondary | ICD-10-CM

## 2022-11-14 DIAGNOSIS — O24113 Pre-existing diabetes mellitus, type 2, in pregnancy, third trimester: Secondary | ICD-10-CM

## 2022-11-14 DIAGNOSIS — O360920 Maternal care for other rhesus isoimmunization, second trimester, not applicable or unspecified: Secondary | ICD-10-CM

## 2022-11-14 DIAGNOSIS — F3162 Bipolar disorder, current episode mixed, moderate: Secondary | ICD-10-CM

## 2022-11-14 DIAGNOSIS — O99210 Obesity complicating pregnancy, unspecified trimester: Secondary | ICD-10-CM

## 2022-11-14 DIAGNOSIS — O0990 Supervision of high risk pregnancy, unspecified, unspecified trimester: Secondary | ICD-10-CM

## 2022-11-14 DIAGNOSIS — Z3A31 31 weeks gestation of pregnancy: Secondary | ICD-10-CM

## 2022-11-14 MED ORDER — ABRYSVO 120 MCG/0.5ML IM SOLR
0.5000 mL | Freq: Once | INTRAMUSCULAR | 0 refills | Status: AC
  Filled 2022-11-14: qty 0.5, 1d supply, fill #0

## 2022-11-14 MED ORDER — ONDANSETRON 4 MG PO TBDP
4.0000 mg | ORAL_TABLET | Freq: Three times a day (TID) | ORAL | 0 refills | Status: DC | PRN
  Filled 2022-11-14: qty 30, 10d supply, fill #0

## 2022-11-14 NOTE — Telephone Encounter (Signed)
Called pt in response to [employer]. Pt experiencing some vomiting this morning. Reports blood sugar of 220s. Pt provided with earlier appt.

## 2022-11-15 ENCOUNTER — Encounter (HOSPITAL_BASED_OUTPATIENT_CLINIC_OR_DEPARTMENT_OTHER): Payer: 59 | Admitting: Obstetrics & Gynecology

## 2022-11-16 ENCOUNTER — Other Ambulatory Visit: Payer: Self-pay | Admitting: *Deleted

## 2022-11-16 ENCOUNTER — Other Ambulatory Visit: Payer: Self-pay | Admitting: Maternal & Fetal Medicine

## 2022-11-16 ENCOUNTER — Ambulatory Visit: Payer: 59 | Admitting: *Deleted

## 2022-11-16 ENCOUNTER — Ambulatory Visit: Payer: 59 | Attending: Maternal & Fetal Medicine

## 2022-11-16 ENCOUNTER — Other Ambulatory Visit: Payer: Self-pay

## 2022-11-16 VITALS — BP 126/47

## 2022-11-16 DIAGNOSIS — O403XX0 Polyhydramnios, third trimester, not applicable or unspecified: Secondary | ICD-10-CM | POA: Diagnosis not present

## 2022-11-16 DIAGNOSIS — O24112 Pre-existing diabetes mellitus, type 2, in pregnancy, second trimester: Secondary | ICD-10-CM

## 2022-11-16 DIAGNOSIS — O0990 Supervision of high risk pregnancy, unspecified, unspecified trimester: Secondary | ICD-10-CM | POA: Diagnosis present

## 2022-11-16 DIAGNOSIS — O409XX0 Polyhydramnios, unspecified trimester, not applicable or unspecified: Secondary | ICD-10-CM | POA: Insufficient documentation

## 2022-11-16 DIAGNOSIS — O24113 Pre-existing diabetes mellitus, type 2, in pregnancy, third trimester: Secondary | ICD-10-CM

## 2022-11-16 DIAGNOSIS — O99213 Obesity complicating pregnancy, third trimester: Secondary | ICD-10-CM

## 2022-11-16 DIAGNOSIS — E669 Obesity, unspecified: Secondary | ICD-10-CM

## 2022-11-16 DIAGNOSIS — O360933 Maternal care for other rhesus isoimmunization, third trimester, fetus 3: Secondary | ICD-10-CM | POA: Diagnosis not present

## 2022-11-16 DIAGNOSIS — O360939 Maternal care for other rhesus isoimmunization, third trimester, other fetus: Secondary | ICD-10-CM | POA: Insufficient documentation

## 2022-11-16 DIAGNOSIS — E1169 Type 2 diabetes mellitus with other specified complication: Secondary | ICD-10-CM | POA: Diagnosis not present

## 2022-11-16 DIAGNOSIS — Z3A32 32 weeks gestation of pregnancy: Secondary | ICD-10-CM

## 2022-11-17 NOTE — Progress Notes (Signed)
PRENATAL VISIT NOTE  Subjective:  [name] is a 22 y.o. G2P0010 at [redacted]w[redacted]d being seen today for ongoing prenatal care.  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  Patient reports  she is having pressure and discomfort with walking .  Contractions: Not present. Vag. Bleeding: None.  Movement: Present. Denies leaking of fluid.   Blood sugars fasting are between 100 - 120.  Most post prandials are <120 but then does have some irregular spikes with blood sugars.  The following portions of the patient's history were reviewed and updated as appropriate: allergies, current medications, past family history, past medical history, past social history, past surgical history and problem list.   Objective:   Vitals:   11/17/22 1318  BP: 138/86  Pulse: (!) 117  Weight: 273 lb (123.8 kg)    Fetal Status: Fetal Heart Rate (bpm): 152 Fundal Height: 34 cm Movement: Present     General:  Alert, oriented and cooperative. Patient is in no acute distress.  Skin: Skin is warm and dry. No rash noted.   Cardiovascular: Normal heart rate noted  Respiratory: Normal respiratory effort, no problems with respiration noted  Abdomen: Soft, gravid, appropriate for gestational age.  Pain/Pressure: Present (pressure)     Pelvic: Cervical exam deferred        Extremities: Normal range of motion.  Edema: Trace   Mental Status: Normal mood and affect. Normal behavior. Normal judgment and thought content.   Assessment and Plan:  Pregnancy: G2P0010 at [redacted]w[redacted]d 1. Supervision of high risk pregnancy, antepartum - on PNV - on baby ASA  2. [redacted] weeks gestation of pregnancy - Respiratory syncytial virus vaccine, preF, subunit, bivalent,(Abrysvo)  3. 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 Moderate mixed bipolar I disorder (HCC)  5. Obesity affecting pregnancy, antepartum, unspecified obesity type  6. Polyhydramnios affecting pregnancy in third trimester  7. Pre-existing type 2 diabetes mellitus during pregnancy in third trimester - using insulin pump with Oran Rein helping as well.  Having weekly BPP/cord dopplers, q 3 week growth scans  Preterm labor symptoms and general obstetric precautions including but not limited to vaginal bleeding, contractions, leaking of fluid and fetal movement were reviewed in detail with the patient. Please refer to After Visit Summary for other counseling recommendations.   Return in about 1 week (around 11/21/2022).  Future Appointments  Date Time Provider Department Center  11/23/2022  2:15 PM [facility] NURSE [facility]  11/23/2022  2:30 PM [facility] US1 [facility]  11/29/2022  9:15 AM [facility] NURSE [facility]  11/29/2022  9:30 AM [facility] US6 [facility]  11/29/2022  3:55 PM Jerene Bears, MD [facility]  12/07/2022  9:00 AM [facility] PHARMACIST [facility] None  12/09/2022  8:15 AM [facility] NURSE [facility]  12/09/2022  8:30 AM [facility] US2 [facility]  12/12/2022  2:55 PM Jerene Bears, MD [facility]  12/20/2022  3:55 PM Jerene Bears, MD [facility]  12/22/2022 10:00 AM Tobb, Lavona Mound, DO [facility] None  Jerene Bears, MD

## 2022-11-20 DIAGNOSIS — R55 Syncope and collapse: Secondary | ICD-10-CM | POA: Diagnosis not present

## 2022-11-20 DIAGNOSIS — R42 Dizziness and giddiness: Secondary | ICD-10-CM

## 2022-11-21 ENCOUNTER — Telehealth: Payer: Self-pay | Admitting: Dietician

## 2022-11-21 NOTE — Telephone Encounter (Signed)
Dexcom Clarity report reviewed. Fasting around 130 for the last 2 days and post meal 170-210. Patient was called. The following insulin pump changes were made:  Max basal 4>>4.5  Basal rate 2.05>>2.25  [name] states that she has had problems with vomiting and does not always know the cause.  This is sometimes in the morning and sometimes later.  She eats what she thinks that she can tolerate and this is not always low in fat.  She is generally eating 2 meals daily. Encouraged patient to eat small, frequent, low fat meals and bolus for each meal/snack.  Patient verbalized understanding.  Oran Rein, RD, LDN, CDCES

## 2022-11-23 ENCOUNTER — Other Ambulatory Visit: Payer: Self-pay

## 2022-11-23 ENCOUNTER — Ambulatory Visit (HOSPITAL_BASED_OUTPATIENT_CLINIC_OR_DEPARTMENT_OTHER): Payer: 59

## 2022-11-23 ENCOUNTER — Ambulatory Visit (HOSPITAL_BASED_OUTPATIENT_CLINIC_OR_DEPARTMENT_OTHER): Payer: 59 | Admitting: Obstetrics & Gynecology

## 2022-11-23 ENCOUNTER — Ambulatory Visit: Payer: 59 | Admitting: *Deleted

## 2022-11-23 ENCOUNTER — Other Ambulatory Visit (HOSPITAL_COMMUNITY)
Admission: RE | Admit: 2022-11-23 | Discharge: 2022-11-23 | Disposition: A | Discharge status: Home / Self Care | Payer: 59 | Source: Ambulatory Visit | Attending: Obstetrics & Gynecology | Admitting: Obstetrics & Gynecology

## 2022-11-23 VITALS — BP 125/80 | HR 106

## 2022-11-23 VITALS — BP 130/59 | HR 100 | Wt 272.8 lb

## 2022-11-23 DIAGNOSIS — O24112 Pre-existing diabetes mellitus, type 2, in pregnancy, second trimester: Secondary | ICD-10-CM | POA: Insufficient documentation

## 2022-11-23 DIAGNOSIS — O99213 Obesity complicating pregnancy, third trimester: Secondary | ICD-10-CM | POA: Insufficient documentation

## 2022-11-23 DIAGNOSIS — O0990 Supervision of high risk pregnancy, unspecified, unspecified trimester: Secondary | ICD-10-CM

## 2022-11-23 DIAGNOSIS — E1169 Type 2 diabetes mellitus with other specified complication: Secondary | ICD-10-CM | POA: Diagnosis not present

## 2022-11-23 DIAGNOSIS — Z87898 Personal history of other specified conditions: Secondary | ICD-10-CM

## 2022-11-23 DIAGNOSIS — Z3A33 33 weeks gestation of pregnancy: Secondary | ICD-10-CM | POA: Diagnosis present

## 2022-11-23 DIAGNOSIS — O409XX0 Polyhydramnios, unspecified trimester, not applicable or unspecified: Secondary | ICD-10-CM

## 2022-11-23 DIAGNOSIS — O403XX0 Polyhydramnios, third trimester, not applicable or unspecified: Secondary | ICD-10-CM

## 2022-11-23 DIAGNOSIS — O24113 Pre-existing diabetes mellitus, type 2, in pregnancy, third trimester: Secondary | ICD-10-CM

## 2022-11-23 DIAGNOSIS — O24913 Unspecified diabetes mellitus in pregnancy, third trimester: Secondary | ICD-10-CM | POA: Insufficient documentation

## 2022-11-23 DIAGNOSIS — E1165 Type 2 diabetes mellitus with hyperglycemia: Secondary | ICD-10-CM | POA: Diagnosis not present

## 2022-11-23 DIAGNOSIS — O360920 Maternal care for other rhesus isoimmunization, second trimester, not applicable or unspecified: Secondary | ICD-10-CM

## 2022-11-23 DIAGNOSIS — O360930 Maternal care for other rhesus isoimmunization, third trimester, not applicable or unspecified: Secondary | ICD-10-CM

## 2022-11-23 DIAGNOSIS — E669 Obesity, unspecified: Secondary | ICD-10-CM

## 2022-11-23 DIAGNOSIS — O99212 Obesity complicating pregnancy, second trimester: Secondary | ICD-10-CM

## 2022-11-23 NOTE — Progress Notes (Signed)
PRENATAL VISIT NOTE  Subjective:  [name] is a 22 y.o. G2P0010 at [redacted]w[redacted]d being seen today for ongoing prenatal care.  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  Patient reports no complaints.  Contractions: Irregular. Vag. Bleeding: None.  Movement: Present. Denies leaking of fluid.   Just had an adjustment with her pump.  Fasting was 100 this morning.  Adjustments were just done Monday.  2 hr pp breakfast yesterday was 160.  She did her insulin at around 6 but ended up doing some work and didn't eat until 7 so 2 hr pp was a little later than would have been if ate withint 15 mintues of bolus.  Other 2 pp were between 120 and 130.    The following portions of the patient's history were reviewed and updated as appropriate: allergies, current medications, past family history, past medical history, past social history, past surgical history and problem list.   Objective:   Vitals:   11/23/22 0818  BP: (!) 130/59  Pulse: 100  Weight: 272 lb 12.8 oz (123.7 kg)    Fetal Status: Fetal Heart Rate (bpm): 147 Fundal Height: 37 cm Movement: Present     General:  Alert, oriented and cooperative. Patient is in no acute distress.  Skin: Skin is warm and dry. No rash noted.   Cardiovascular: Normal heart rate noted  Respiratory: Normal respiratory effort, no problems with respiration noted   Abdomen: Soft, gravid, appropriate for gestational age.  Pain/Pressure: Present     Pelvic: Cervical exam deferred        Extremities: Normal range of motion.  Edema: None  Mental Status: Normal mood and affect. Normal behavior. Normal judgment and thought content.   Assessment and Plan:  Pregnancy: G2P0010 at [redacted]w[redacted]d 1. Supervision of high risk pregnancy, antepartum - on baby ASA and PNV - Culture, beta strep (group b only) - Cervicovaginal ancillary only( [place])  2. [redacted] weeks gestation of pregnancy - recheck 1 week - Culture, beta strep (group b only) - Cervicovaginal ancillary only( [place])  3. Pre-existing type 2 diabetes mellitus during pregnancy in third trimester - has BPP and dopplers today.  Has elevated S/D ratios as appt last week  4. Polyhydramnios affecting pregnancy in third trimester  5. 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 History of tachycardia - on metoprolol for this and has been since beginning of pregnancy   Preterm labor symptoms and general obstetric precautions including but not limited to vaginal bleeding, contractions, leaking of fluid and fetal movement were reviewed in detail with the patient. Please refer to After Visit Summary for other counseling recommendations.   Return in about 1 week (around 11/30/2022).  Future Appointments  Date Time Provider Department Center  11/23/2022  2:15 PM [facility] NURSE [facility]  11/23/2022  2:30 PM [facility] US1 [facility]  11/29/2022  9:15 AM [facility] NURSE [facility]  11/29/2022  9:30 AM [facility] US6 [facility]  12/05/2022 10:55  AM Jerene Bears, MD [facility]  12/07/2022  9:00 AM [facility] PHARMACIST [facility] None  12/09/2022  8:15 AM [facility] NURSE [facility]  12/09/2022  8:30 AM [facility] US2 [facility]  12/12/2022  2:55 PM Jerene Bears, MD [facility]  12/20/2022  3:55 PM  Jerene Bears, MD [facility]  12/22/2022 10:00 AM Thomasene Ripple, DO [facility] None    Jerene Bears, MD

## 2022-11-24 ENCOUNTER — Telehealth: Payer: Self-pay | Admitting: Dietician

## 2022-11-24 LAB — CERVICOVAGINAL ANCILLARY ONLY
Chlamydia: NEGATIVE
Comment: NEGATIVE
Comment: NORMAL
Neisseria Gonorrhea: NEGATIVE

## 2022-11-24 NOTE — Telephone Encounter (Signed)
Dexcom Clarity reviewed.  Fasting glucose 110, Post meal 147-243 today Called patient to make further insulin pump changes:  Max Basal 4.5>>5  Basal rate 2.25>>2.5  ICR 3>>2.7  Nausea has improved since most recent insulin pump changes. She states that she has ketone strips but has not tested. She asked when should she go to the hospital regarding hyperglycemia.  Disucssed that she should seek medical attention with moderate to high ketones. Discussed tips to maintain a better blood glucose such as mindful eating, portion control, and walking daily.  Discussed that her blood glucose will decrease drastically after the baby is born and that it may be best to remove the POD after birth.  Discussed that she should discuss her insulin needs after the birth with her MD.  Oran Rein, RD, LDN, CDCES

## 2022-11-27 LAB — CULTURE, BETA STREP (GROUP B ONLY): Strep Gp B Culture: NEGATIVE

## 2022-11-29 ENCOUNTER — Ambulatory Visit: Payer: 59 | Attending: Maternal & Fetal Medicine

## 2022-11-29 ENCOUNTER — Other Ambulatory Visit: Payer: Self-pay

## 2022-11-29 ENCOUNTER — Other Ambulatory Visit: Payer: Self-pay | Admitting: Maternal & Fetal Medicine

## 2022-11-29 ENCOUNTER — Encounter (HOSPITAL_BASED_OUTPATIENT_CLINIC_OR_DEPARTMENT_OTHER): Payer: 59 | Admitting: Obstetrics & Gynecology

## 2022-11-29 ENCOUNTER — Telehealth: Payer: Self-pay | Admitting: Dietician

## 2022-11-29 ENCOUNTER — Ambulatory Visit: Payer: 59

## 2022-11-29 ENCOUNTER — Ambulatory Visit: Payer: 59 | Admitting: *Deleted

## 2022-11-29 VITALS — BP 134/70 | HR 92

## 2022-11-29 DIAGNOSIS — E669 Obesity, unspecified: Secondary | ICD-10-CM | POA: Diagnosis not present

## 2022-11-29 DIAGNOSIS — O24112 Pre-existing diabetes mellitus, type 2, in pregnancy, second trimester: Secondary | ICD-10-CM | POA: Diagnosis present

## 2022-11-29 DIAGNOSIS — O0990 Supervision of high risk pregnancy, unspecified, unspecified trimester: Secondary | ICD-10-CM | POA: Diagnosis present

## 2022-11-29 DIAGNOSIS — O409XX0 Polyhydramnios, unspecified trimester, not applicable or unspecified: Secondary | ICD-10-CM | POA: Diagnosis present

## 2022-11-29 DIAGNOSIS — E1165 Type 2 diabetes mellitus with hyperglycemia: Secondary | ICD-10-CM | POA: Insufficient documentation

## 2022-11-29 DIAGNOSIS — Z794 Long term (current) use of insulin: Secondary | ICD-10-CM | POA: Diagnosis present

## 2022-11-29 DIAGNOSIS — O99213 Obesity complicating pregnancy, third trimester: Secondary | ICD-10-CM

## 2022-11-29 DIAGNOSIS — O24113 Pre-existing diabetes mellitus, type 2, in pregnancy, third trimester: Secondary | ICD-10-CM

## 2022-11-29 DIAGNOSIS — Z3A34 34 weeks gestation of pregnancy: Secondary | ICD-10-CM

## 2022-11-29 DIAGNOSIS — O403XX0 Polyhydramnios, third trimester, not applicable or unspecified: Secondary | ICD-10-CM | POA: Diagnosis not present

## 2022-11-29 NOTE — Telephone Encounter (Signed)
Dexcom clarity reviewed. Fasting blood glucose 105 and post meal 127-218. Called patient and discussed for her to be very specific regarding carb counting. Her Max bolus is set at 30 and this is the max that the [employer] allows.  Requested that she rebolus a couple of hours after a meal if she remains high. The following pump changes were made by patient:  Duration of insulin action 4>>3  ICR 2.7>>2.4    Oran Rein, RD, LDN, CDCES

## 2022-11-29 NOTE — Procedures (Signed)
[name] 05-Apr-2000 [redacted]w[redacted]d  Fetus A Non-Stress Test Interpretation for 11/29/22  Indication: Diabetes and Polyhydramnios  Fetal Heart Rate A Mode: External Baseline Rate (A): 125 bpm Variability: Moderate Accelerations: 15 x 15 Decelerations: None Multiple birth?: No  Uterine Activity Mode: Toco Contraction Frequency (min): No UC's Resting Tone Palpated: Relaxed Resting Time: Adequate  Interpretation (Fetal Testing) Nonstress Test Interpretation: Reactive Overall Impression: Reassuring for gestational age Comments: Tracing reviewed by Dr.Schaible

## 2022-11-30 ENCOUNTER — Ambulatory Visit (HOSPITAL_BASED_OUTPATIENT_CLINIC_OR_DEPARTMENT_OTHER): Payer: 59 | Admitting: Certified Nurse Midwife

## 2022-11-30 VITALS — BP 122/65 | HR 85 | Wt 274.4 lb

## 2022-11-30 DIAGNOSIS — Z3A34 34 weeks gestation of pregnancy: Secondary | ICD-10-CM

## 2022-11-30 DIAGNOSIS — O24113 Pre-existing diabetes mellitus, type 2, in pregnancy, third trimester: Secondary | ICD-10-CM

## 2022-11-30 DIAGNOSIS — O403XX0 Polyhydramnios, third trimester, not applicable or unspecified: Secondary | ICD-10-CM

## 2022-11-30 DIAGNOSIS — O0990 Supervision of high risk pregnancy, unspecified, unspecified trimester: Secondary | ICD-10-CM

## 2022-11-30 NOTE — Progress Notes (Signed)
    PRENATAL VISIT NOTE  Subjective:  [name] is a 22 y.o. G2P0010 at [redacted]w[redacted]d being seen today for ongoing prenatal care.  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  Patient reports no bleeding, no contractions, no cramping, and no leaking.  Contractions: Not present. Vag. Bleeding: None.  Movement: Present. Denies leaking of fluid.   The following portions of the patient's history were reviewed and updated as appropriate: allergies, current medications, past family history, past medical history, past social history, past surgical history and problem list.   Objective:   Vitals:   11/30/22 0901  BP: 122/65  Pulse: 85  Weight: 274 lb 6.4 oz (124.5 kg)    Fetal Status: Fetal Heart Rate (bpm): 140 Fundal Height: 44 cm Movement: Present  Presentation: Vertex  General:  Alert, oriented and cooperative. Patient is in no acute distress.  Skin: Skin is warm and dry. No rash noted.   Cardiovascular: Normal heart rate noted  Respiratory: Normal respiratory effort, no problems with respiration noted  Abdomen: Soft, gravid, appropriate for gestational age.  Pain/Pressure: Present     Pelvic: Cervical exam deferred        Extremities: Normal range of motion.  Edema: None  Mental Status: Normal mood and affect. Normal behavior. Normal judgment and thought content.   Assessment and Plan:  Pregnancy:  G2P0010 at [redacted]w[redacted]d 1. [redacted] weeks gestation of pregnancy - Discussed signs/symptoms that need to be reported and when to report to MAU (vaginal bleeding, abdominal or pelvic pain, leakage of fluid from vagina, decreased fetal movement, etc.)  2. Supervision of high risk pregnancy, antepartum - Antenatal surveillance in progress  3. Pre-existing type 2 diabetes mellitus during pregnancy in third trimester - Pt has CGM and insulin pump. Fasting this morning around 115.  4. Polyhydramnios affecting pregnancy in third trimester - Followed by MFM - [place] (11/29/22): Vtx, posterior fundal placenta, AFI 44.52 (>97%), BPP 10/10, severe polyhydramnios, UA dopplers elevated but no absent or reversed flow. Next [place] 12/05/22.   Preterm labor symptoms and general obstetric precautions including but not limited to vaginal bleeding, contractions, leaking of fluid and fetal movement were reviewed in detail with the patient. Please refer to After Visit Summary for other counseling recommendations.   No follow-ups on file.  Future Appointments  Date Time Provider Department Center  12/05/2022 10:55 AM Jerene Bears, MD [facility]  12/05/2022  2:00 PM [facility] NURSE [facility]  12/05/2022  2:15 PM [facility] NST [facility]  12/07/2022  9:00 AM [facility] PHARMACIST [facility] None  12/08/2022  8:30 AM [facility] NURSE [facility]  12/08/2022  8:45 AM [facility] NST [facility]  12/09/2022  8:15 AM [facility] NURSE [facility]  12/09/2022  8:30 AM [facility] US2 [facility]  12/12/2022  2:55 PM Jerene Bears, MD [facility]  12/13/2022  6:45 AM [facility] SCHED ROOM [facility] None  12/20/2022  3:55 PM Jerene Bears, MD [facility]  12/22/2022 10:00 AM Thomasene Ripple, DO [facility] None    Letta Kocher, CNM

## 2022-12-03 ENCOUNTER — Inpatient Hospital Stay (HOSPITAL_COMMUNITY)
Admission: AD | Admit: 2022-12-03 | Discharge: 2022-12-03 | Disposition: A | Discharge status: Home / Self Care | Payer: 59 | Attending: Obstetrics & Gynecology | Admitting: Obstetrics & Gynecology

## 2022-12-03 ENCOUNTER — Encounter (HOSPITAL_COMMUNITY): Payer: Self-pay | Admitting: Obstetrics & Gynecology

## 2022-12-03 ENCOUNTER — Other Ambulatory Visit: Payer: Self-pay

## 2022-12-03 DIAGNOSIS — Z3689 Encounter for other specified antenatal screening: Secondary | ICD-10-CM

## 2022-12-03 DIAGNOSIS — Z3A34 34 weeks gestation of pregnancy: Secondary | ICD-10-CM | POA: Insufficient documentation

## 2022-12-03 DIAGNOSIS — Z3493 Encounter for supervision of normal pregnancy, unspecified, third trimester: Secondary | ICD-10-CM

## 2022-12-03 DIAGNOSIS — O368130 Decreased fetal movements, third trimester, not applicable or unspecified: Secondary | ICD-10-CM | POA: Insufficient documentation

## 2022-12-03 LAB — ANTIBODY IDENTIFICATION

## 2022-12-03 LAB — ANTIBODY SCREEN

## 2022-12-03 LAB — AB SCR+ANTIBODY ID: Antibody Screen: POSITIVE — AB

## 2022-12-03 NOTE — MAU Note (Signed)
.  [name] is a 22 y.o. at [redacted]w[redacted]d here in MAU reporting: DFM since around midnight on 11/30. May have felt some movement around 12 but unsure if it was a result of her pushing on her belly. Denies contractions, VB or LOF.   Onset of complaint: 12/03/22 at 0000 Pain score: denies Vitals:   12/03/22 1443  BP: (!) 132/54  Pulse: 100  Resp: 20  Temp: 97.7 F (36.5 C)  SpO2: 99%     FHT:150s

## 2022-12-03 NOTE — MAU Provider Note (Signed)
History     CSN: [id number]  Arrival date and time: 12/03/22 1427   Event Date/Time   First Provider Initiated Contact with Patient 12/03/22 1511      Chief Complaint  Patient presents with   Decreased Fetal Movement   [name] is a 22 y.o. at [redacted]w[redacted]d presents to MAU reporting: DFM since around midnight on 11/30. Reports she has felt approximately five movements since arrival to MAU. Denies LOF, VB.     OB History     Gravida  2   Para      Term      Preterm      AB  1   Living         SAB  1   IAB      Ectopic      Multiple      Live Births              Past Medical History:  Diagnosis Date   Acquired acanthosis nigricans    Anxiety    Bipolar affective (HCC)    with depression and anxiety.   Chronic headaches 09/10/2013   Constipation 11/13/2014   Deliberate self-cutting    last done 1 month ago to left arm   Depression    Diabetes mellitus without complication (HCC)    Type II   Dyspepsia    Episodic mood disorder (HCC) 07/12/2013   Goiter    Hidradenitis 07/24/2022   Uses topical clindamycin solution  Needed abx (keflex) for furuncle on 7/19     Menorrhagia    required transfusion in 04/2013   Morbid obesity (HCC) 04/30/2013   Non-alcoholic fatty liver disease 11/13/2013   Obesity    Polycystic ovary disease    Reflux    Tachycardia    Transfusion history    Vitamin D deficiency 11/13/2013    Past Surgical History:  Procedure Laterality Date   TONSILLECTOMY Bilateral 2018   TYMPANOSTOMY TUBE PLACEMENT      Family History  Problem Relation Age of Onset   Diabetes Mother    Diabetes Father    Bipolar disorder Father    Heart disease Father 110       AMI   Heart attack Father    Diabetes Maternal Aunt    Diabetes Maternal Uncle    Heart disease Maternal Uncle    Cancer Paternal Aunt        lung metastatic to breat and liver   Cancer Maternal Grandmother 68       lung cancer   Hypertension Maternal Grandmother     Hypertension Maternal Grandfather    Diabetes Maternal Grandfather    Heart disease Maternal Grandfather    Schizophrenia Paternal Grandmother     Social History   Tobacco Use   Smoking status: Never   Smokeless tobacco: Never  Vaping Use   Vaping status: Former   Quit date: 04/09/2022  Substance Use Topics   Alcohol use: No    Alcohol/week: 0.0 standard drinks of alcohol   Drug use: No    Allergies: No Known Allergies  No medications prior to admission.    Review of Systems  Constitutional:  Negative for chills, fatigue, fever and unexpected weight change.  Respiratory:  Negative for cough and shortness of breath.   Cardiovascular:  Negative for chest pain and palpitations.  Gastrointestinal:  Negative for abdominal pain, constipation, diarrhea, nausea and vomiting.  Genitourinary:  Negative for difficulty urinating, flank pain, frequency and urgency.  Physical Exam   Blood pressure 127/63, pulse 92, temperature 97.7 F (36.5 C), temperature source Oral, resp. rate 20, height 5\' 5"  (1.651 m), weight 123.3 kg, last menstrual period 04/05/2022, SpO2 98%.  Physical Exam Vitals reviewed.  Constitutional:      Appearance: Normal appearance.  HENT:     Head: Normocephalic.  Cardiovascular:     Rate and Rhythm: Normal rate.  Pulmonary:     Effort: Pulmonary effort is normal.  Skin:    General: Skin is warm and dry.     Capillary Refill: Capillary refill takes less than 2 seconds.  Neurological:     Mental Status: She is alert and oriented to person, place, and time.  Psychiatric:        Mood and Affect: Mood normal.        Behavior: Behavior normal.        Thought Content: Thought content normal.        Judgment: Judgment normal.     Fetal Assessment 135 bpm, Mod Var, -Decels, +Accels Toco: UI  MAU Course  No results found for this or any previous visit (from the past 24 hour(s)). No results found.  MDM PE Labs: none EFM  Assessment and Plan  22yo   G2 P0010  SIUP at 34.4 weeks Cat 1  FT - Patient endorses regular, vigorous fetal movement. Able to utilize fetal movement marker to indicate perceived movements.   - Exam findings discussed.  Reactive NST with well-perceived fetal movement now.  - Keep prenatal appointment 12/05/22. - Return to MAU PRN. - Discharged home in stable condition.    Richardson Landry MSN, CNM 12/03/2022, 5:48 PM

## 2022-12-05 ENCOUNTER — Ambulatory Visit: Payer: 59 | Attending: Maternal & Fetal Medicine | Admitting: *Deleted

## 2022-12-05 ENCOUNTER — Encounter: Payer: Self-pay | Admitting: *Deleted

## 2022-12-05 ENCOUNTER — Telehealth: Payer: Self-pay | Admitting: Dietician

## 2022-12-05 ENCOUNTER — Ambulatory Visit (HOSPITAL_BASED_OUTPATIENT_CLINIC_OR_DEPARTMENT_OTHER): Payer: 59 | Admitting: Obstetrics & Gynecology

## 2022-12-05 ENCOUNTER — Ambulatory Visit: Payer: 59 | Admitting: *Deleted

## 2022-12-05 ENCOUNTER — Other Ambulatory Visit: Payer: Self-pay

## 2022-12-05 ENCOUNTER — Other Ambulatory Visit: Payer: 59

## 2022-12-05 ENCOUNTER — Other Ambulatory Visit (HOSPITAL_BASED_OUTPATIENT_CLINIC_OR_DEPARTMENT_OTHER): Payer: Self-pay

## 2022-12-05 VITALS — BP 128/56 | HR 94 | Wt 274.4 lb

## 2022-12-05 VITALS — BP 122/64 | HR 96

## 2022-12-05 DIAGNOSIS — L299 Pruritus, unspecified: Secondary | ICD-10-CM

## 2022-12-05 DIAGNOSIS — E119 Type 2 diabetes mellitus without complications: Secondary | ICD-10-CM | POA: Diagnosis not present

## 2022-12-05 DIAGNOSIS — K831 Obstruction of bile duct: Secondary | ICD-10-CM

## 2022-12-05 DIAGNOSIS — O360920 Maternal care for other rhesus isoimmunization, second trimester, not applicable or unspecified: Secondary | ICD-10-CM

## 2022-12-05 DIAGNOSIS — Z3A34 34 weeks gestation of pregnancy: Secondary | ICD-10-CM | POA: Insufficient documentation

## 2022-12-05 DIAGNOSIS — O0990 Supervision of high risk pregnancy, unspecified, unspecified trimester: Secondary | ICD-10-CM

## 2022-12-05 DIAGNOSIS — O24113 Pre-existing diabetes mellitus, type 2, in pregnancy, third trimester: Secondary | ICD-10-CM | POA: Diagnosis present

## 2022-12-05 DIAGNOSIS — Z794 Long term (current) use of insulin: Secondary | ICD-10-CM

## 2022-12-05 DIAGNOSIS — O403XX0 Polyhydramnios, third trimester, not applicable or unspecified: Secondary | ICD-10-CM

## 2022-12-05 DIAGNOSIS — E1165 Type 2 diabetes mellitus with hyperglycemia: Secondary | ICD-10-CM

## 2022-12-05 DIAGNOSIS — F50819 Binge eating disorder, unspecified: Secondary | ICD-10-CM

## 2022-12-05 MED ORDER — METFORMIN HCL 500 MG PO TABS: 1000.0000 mg | ORAL_TABLET | Freq: Two times a day (BID) | ORAL | 1 refills | Status: DC

## 2022-12-05 NOTE — Procedures (Signed)
[name] Sep 15, 2000 [redacted]w[redacted]d  Fetus A Non-Stress Test Interpretation for 12/05/22  NST only  Indication: Diabetes  Fetal Heart Rate A Mode: External Baseline Rate (A): 130 bpm Variability: Moderate Accelerations: 15 x 15 Decelerations: None Multiple birth?: No  Uterine Activity Mode: Palpation, Toco Contraction Frequency (min): ui Resting Tone Palpated: Relaxed  Interpretation (Fetal Testing) Nonstress Test Interpretation: Reactive Overall Impression: Reassuring for gestational age Comments: Dr. Judeth Cornfield reviewed tracing

## 2022-12-05 NOTE — Progress Notes (Signed)
PRENATAL VISIT NOTE  Subjective:  [name] is a 22 y.o. G2P0010 at [redacted]w[redacted]d being seen today for ongoing prenatal care.  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  Patient reports  she had decreased fetal movement on Saturday and did go to MAU.  Monitoring was good and her baby has been very active since then.  Blood sugar was elevated yesterday after breakfast and despite several insulin bolus doses, took several hours to come down from 170's to 120's.  Was 120 this morning when she woke up.  Is 93 right now .  Contractions: Irregular. Vag. Bleeding: None.  Movement: Present. Denies leaking of fluid.   Pt did not mention this but as I was walking her out, she was itching her skin.  Reports this has been present about two weeks and is all over.  Will order bile acid testing.  The following portions of the patient's history were reviewed and updated as appropriate: allergies, current medications, past family history, past medical history, past social history, past surgical history and problem list.   Objective:   Vitals:   12/05/22 1103  BP: (!) 128/56  Pulse: 94  Weight: 274 lb 6.4 oz (124.5 kg)    Fetal Status: Fetal Heart Rate (bpm): 124 Fundal Height: 40 cm Movement: Present     General:  Alert, oriented and cooperative. Patient is in no acute distress.  Skin: Skin is warm and dry.  No rash noted.   Cardiovascular: Normal heart rate noted  Respiratory: Normal respiratory effort, no problems with respiration noted  Abdomen: Soft, gravid, appropriate for gestational age.  Pain/Pressure: Present     Pelvic: Cervical exam deferred        Extremities: Normal range of motion.  Edema: None  Mental Status: Normal mood and affect. Normal behavior. Normal judgment and thought content.   Assessment and Plan:  Pregnancy: G2P0010 at [redacted]w[redacted]d 1. Supervision of high risk pregnancy, antepartum - on PNV and baby ASA  2. [redacted] weeks gestation of pregnancy - delivery recommended at 36 weeks by MFM.  Is scheduled for induction on 12/10.  Discussed process for induction today.  Questions answered.  3. Pre-existing type 2 diabetes mellitus during pregnancy in third trimester - metFORMIN (GLUCOPHAGE) 500 MG tablet; Take 2 tablets (1,000 mg total) by mouth 2 (two) times daily with a meal.  Dispense: 120 tablet; Refill: 1 - she communicated with Oran Rein this morning and basal insulin rate adjusted - has twice weekly NST scheduled and BPP scheduled with cord dopplers on Friday.  Growth scan on Friday scheduled as well.  4. Itching - Bile acids, total  5. Binge eating disorder, unspecified severity  6. Polyhydramnios affecting pregnancy in third trimester  7. 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 Please refer to After Visit Summary for other counseling recommendations.   Return  in about 1 week (around 12/12/2022).  Future Appointments  Date Time Provider Department Center  12/05/2022  2:00 PM [facility] NURSE [facility]  12/05/2022  2:15 PM [facility] NST [facility]  12/07/2022  9:00 AM [facility] PHARMACIST [facility]  None  12/08/2022  8:30 AM [facility] NURSE [facility]  12/08/2022  8:45 AM [facility] NST [facility]  12/09/2022  8:15 AM [facility] NURSE [facility]  12/09/2022  8:30 AM [facility] US2 [facility]  12/12/2022  2:55 PM Jerene Bears, MD [facility]  12/13/2022  6:45 AM [facility] SCHED ROOM [facility] None  12/22/2022 10:00 AM Tobb, Lavona Mound, DO [facility] None    Jerene Bears, MD

## 2022-12-05 NOTE — Telephone Encounter (Signed)
Dexcom Clarity report reviewed. Fasting blood glucose 105-160 and post meal 170-308 in the past 48 hours. Called patient who stated that she had a very restless night last night and blood glucose remained high all night despite bolusing.  It has since decreased.  We spoke about this in relationship to her site.  Thoughts are that her site is fine as her blood glucose has decreased after her breakfast bolus.  The following pump changes were made by patient:  Basal rate 2.5>>2.75  Further discussed the max bolus cap of 30 units in the Omnipod DASH pump. Discussed giving the meal bolus and then giving the remainder of the bolus a couple of minutes later.  Patient states that her induction date has been set for December 10.  Oran Rein, RD, LDN, CDCES

## 2022-12-06 ENCOUNTER — Telehealth (HOSPITAL_COMMUNITY): Payer: Self-pay | Admitting: *Deleted

## 2022-12-06 ENCOUNTER — Other Ambulatory Visit (HOSPITAL_BASED_OUTPATIENT_CLINIC_OR_DEPARTMENT_OTHER): Payer: Self-pay

## 2022-12-06 ENCOUNTER — Encounter (HOSPITAL_COMMUNITY): Payer: Self-pay | Admitting: *Deleted

## 2022-12-06 NOTE — Telephone Encounter (Signed)
Preadmission screen  

## 2022-12-07 ENCOUNTER — Ambulatory Visit: Payer: 59

## 2022-12-07 ENCOUNTER — Other Ambulatory Visit: Payer: Self-pay | Admitting: Advanced Practice Midwife

## 2022-12-07 LAB — BILE ACIDS, TOTAL: Bile Acids Total: 4.3 umol/L (ref 0.0–10.0)

## 2022-12-08 ENCOUNTER — Ambulatory Visit: Payer: 59 | Attending: Maternal & Fetal Medicine | Admitting: *Deleted

## 2022-12-08 ENCOUNTER — Other Ambulatory Visit: Payer: Self-pay

## 2022-12-08 ENCOUNTER — Telehealth (HOSPITAL_BASED_OUTPATIENT_CLINIC_OR_DEPARTMENT_OTHER): Payer: Self-pay | Admitting: Obstetrics & Gynecology

## 2022-12-08 ENCOUNTER — Telehealth: Payer: Self-pay | Admitting: Dietician

## 2022-12-08 ENCOUNTER — Ambulatory Visit: Payer: 59 | Admitting: *Deleted

## 2022-12-08 ENCOUNTER — Other Ambulatory Visit (HOSPITAL_BASED_OUTPATIENT_CLINIC_OR_DEPARTMENT_OTHER): Payer: Self-pay | Admitting: Obstetrics & Gynecology

## 2022-12-08 VITALS — BP 139/78 | HR 127

## 2022-12-08 DIAGNOSIS — O99213 Obesity complicating pregnancy, third trimester: Secondary | ICD-10-CM | POA: Insufficient documentation

## 2022-12-08 DIAGNOSIS — O24113 Pre-existing diabetes mellitus, type 2, in pregnancy, third trimester: Secondary | ICD-10-CM | POA: Diagnosis present

## 2022-12-08 DIAGNOSIS — Z3A35 35 weeks gestation of pregnancy: Secondary | ICD-10-CM | POA: Diagnosis not present

## 2022-12-08 DIAGNOSIS — O0990 Supervision of high risk pregnancy, unspecified, unspecified trimester: Secondary | ICD-10-CM

## 2022-12-08 DIAGNOSIS — O403XX0 Polyhydramnios, third trimester, not applicable or unspecified: Secondary | ICD-10-CM | POA: Insufficient documentation

## 2022-12-08 DIAGNOSIS — Z3490 Encounter for supervision of normal pregnancy, unspecified, unspecified trimester: Secondary | ICD-10-CM

## 2022-12-08 DIAGNOSIS — O409XX0 Polyhydramnios, unspecified trimester, not applicable or unspecified: Secondary | ICD-10-CM

## 2022-12-08 NOTE — Telephone Encounter (Signed)
Insulin pump:  Omnipod DASH Dexcom Clarity report reviewed.  Fasting glucose 95-120, Post meal glucose 210-275 Called patient.  She reports bolusing 15 minutes before a meal. Patient made the following pump adjustments:  ICR 2.4>>2    Oran Rein, RD, LDN, CDCES

## 2022-12-08 NOTE — Procedures (Signed)
[name] 11/18/2000 [redacted]w[redacted]d  Fetus A Non-Stress Test Interpretation for 12/08/22-NST only  Indication: Diabetes, Polyhydramnios, and Obese  Fetal Heart Rate A Mode: External Baseline Rate (A): 140 bpm Variability: Moderate Accelerations: 15 x 15 Decelerations: None Multiple birth?: No  Uterine Activity Mode: Toco Contraction Frequency (min): 6-8 min Contraction Duration (sec): 60-80 Contraction Quality: Mild (pt feels them as tight and a little pressure) Resting Tone Palpated: Relaxed  Interpretation (Fetal Testing) Nonstress Test Interpretation: Reactive Comments: Tracing reviewed By Dr. Judeth Cornfield

## 2022-12-08 NOTE — Telephone Encounter (Signed)
Called pt in late afternoon (late entry for phone call) due to communication from Oran Rein, diabetic educator, regarding pt's blood sugars.  Was reported that pp are up to 275.  Called to discuss admission with pt for better blood sugar control vs adding NPH to insulin regimen.  Pt reports that her blood sugar did spike once with blood sugar being ~275 right after eating, this blood sugar did come down to normal range just with typical bolus used with her carb counting.  Avg blood sugars per her monitor at 134 over last few days and 131 over last month.  She does report some contractions.  Has polyhydramnios so this is reasonable.  Still feeling good FM.  Pt has BPP, cord dopplers and growth scan tomorrow.  She does not want admission at this time.  I am more reassured abou her blood sugars after discussing with her.  Also, she reports having a low today in the 40s.  She needed glucose tabs to get it back up.  When taking with pt, her blood sugar at that time was in the 90's.  She did adjust her basal rate this morning and the low was after that adjustment.  Also feel adding NPH at this time could increase risks of significant hypoglycemic episode.  Will continue to monitor.  Has MFM appt tomorrow, f/u with office Monday 12/9 and then induction scheduled at 36 weeks on 12/10.  Induction orders placed.

## 2022-12-09 ENCOUNTER — Other Ambulatory Visit: Payer: Self-pay

## 2022-12-09 ENCOUNTER — Telehealth: Payer: Self-pay | Admitting: Dietician

## 2022-12-09 ENCOUNTER — Ambulatory Visit: Payer: 59 | Admitting: *Deleted

## 2022-12-09 ENCOUNTER — Ambulatory Visit: Payer: 59

## 2022-12-09 VITALS — BP 138/71 | HR 98

## 2022-12-09 DIAGNOSIS — E669 Obesity, unspecified: Secondary | ICD-10-CM

## 2022-12-09 DIAGNOSIS — O99213 Obesity complicating pregnancy, third trimester: Secondary | ICD-10-CM

## 2022-12-09 DIAGNOSIS — O403XX0 Polyhydramnios, third trimester, not applicable or unspecified: Secondary | ICD-10-CM

## 2022-12-09 DIAGNOSIS — O24113 Pre-existing diabetes mellitus, type 2, in pregnancy, third trimester: Secondary | ICD-10-CM

## 2022-12-09 DIAGNOSIS — O0990 Supervision of high risk pregnancy, unspecified, unspecified trimester: Secondary | ICD-10-CM

## 2022-12-09 DIAGNOSIS — Z3A35 35 weeks gestation of pregnancy: Secondary | ICD-10-CM

## 2022-12-09 DIAGNOSIS — O24112 Pre-existing diabetes mellitus, type 2, in pregnancy, second trimester: Secondary | ICD-10-CM

## 2022-12-09 DIAGNOSIS — O360930 Maternal care for other rhesus isoimmunization, third trimester, not applicable or unspecified: Secondary | ICD-10-CM

## 2022-12-09 DIAGNOSIS — O409XX0 Polyhydramnios, unspecified trimester, not applicable or unspecified: Secondary | ICD-10-CM

## 2022-12-09 NOTE — Telephone Encounter (Signed)
Insulin pump:  Omnipod DASH Dexcom Clarity report reviewed.  Fasting glucose 90, Post meal glucose 190 but has had 3 lows of 57 during the day and night. Returned patient call.Marland Kitchen  NPH was not started due to the low. [name] is eating small meals and snacks and bolusing. Patient made the following pump adjustments:  Basal rate 2.75>>2.5 Patient to call me for concerns. Induction remains scheduled for 12/13/2022.  Oran Rein, RD, LDN, CDCES

## 2022-12-10 ENCOUNTER — Encounter (HOSPITAL_COMMUNITY): Payer: Self-pay | Admitting: Obstetrics and Gynecology

## 2022-12-10 ENCOUNTER — Inpatient Hospital Stay (HOSPITAL_COMMUNITY)
Admission: AD | Admit: 2022-12-10 | Discharge: 2022-12-10 | Disposition: A | Discharge status: Home / Self Care | Payer: 59 | Attending: Obstetrics and Gynecology | Admitting: Obstetrics and Gynecology

## 2022-12-10 ENCOUNTER — Inpatient Hospital Stay (HOSPITAL_COMMUNITY): Payer: 59

## 2022-12-10 DIAGNOSIS — Z3689 Encounter for other specified antenatal screening: Secondary | ICD-10-CM

## 2022-12-10 DIAGNOSIS — R0789 Other chest pain: Secondary | ICD-10-CM | POA: Insufficient documentation

## 2022-12-10 DIAGNOSIS — O2412 Pre-existing diabetes mellitus, type 2, in childbirth: Secondary | ICD-10-CM | POA: Diagnosis not present

## 2022-12-10 DIAGNOSIS — Z3A35 35 weeks gestation of pregnancy: Secondary | ICD-10-CM

## 2022-12-10 DIAGNOSIS — O26893 Other specified pregnancy related conditions, third trimester: Secondary | ICD-10-CM | POA: Insufficient documentation

## 2022-12-10 DIAGNOSIS — O4703 False labor before 37 completed weeks of gestation, third trimester: Secondary | ICD-10-CM | POA: Insufficient documentation

## 2022-12-10 DIAGNOSIS — O479 False labor, unspecified: Secondary | ICD-10-CM

## 2022-12-10 DIAGNOSIS — R109 Unspecified abdominal pain: Secondary | ICD-10-CM | POA: Insufficient documentation

## 2022-12-10 LAB — CBC WITH DIFFERENTIAL/PLATELET
Abs Immature Granulocytes: 0.05 10*3/uL (ref 0.00–0.07)
Basophils Absolute: 0 10*3/uL (ref 0.0–0.1)
Basophils Relative: 0 %
Eosinophils Absolute: 0.2 10*3/uL (ref 0.0–0.5)
Eosinophils Relative: 2 %
HCT: 32.6 % — ABNORMAL LOW (ref 36.0–46.0)
Hemoglobin: 10.8 g/dL — ABNORMAL LOW (ref 12.0–15.0)
Immature Granulocytes: 1 %
Lymphocytes Relative: 23 %
Lymphs Abs: 2.3 10*3/uL (ref 0.7–4.0)
MCH: 30.6 pg (ref 26.0–34.0)
MCHC: 33.1 g/dL (ref 30.0–36.0)
MCV: 92.4 fL (ref 80.0–100.0)
Monocytes Absolute: 0.7 10*3/uL (ref 0.1–1.0)
Monocytes Relative: 8 %
Neutro Abs: 6.5 10*3/uL (ref 1.7–7.7)
Neutrophils Relative %: 66 %
Platelets: 257 10*3/uL (ref 150–400)
RBC: 3.53 MIL/uL — ABNORMAL LOW (ref 3.87–5.11)
RDW: 16.2 % — ABNORMAL HIGH (ref 11.5–15.5)
WBC: 9.9 10*3/uL (ref 4.0–10.5)
nRBC: 0 % (ref 0.0–0.2)

## 2022-12-10 LAB — URINALYSIS, ROUTINE W REFLEX MICROSCOPIC
Bilirubin Urine: NEGATIVE
Glucose, UA: NEGATIVE mg/dL
Hgb urine dipstick: NEGATIVE
Ketones, ur: NEGATIVE mg/dL
Leukocytes,Ua: NEGATIVE
Nitrite: NEGATIVE
Protein, ur: NEGATIVE mg/dL
Specific Gravity, Urine: 1.006 (ref 1.005–1.030)
pH: 6 (ref 5.0–8.0)

## 2022-12-10 LAB — COMPREHENSIVE METABOLIC PANEL
ALT: 19 U/L (ref 0–44)
AST: 19 U/L (ref 15–41)
Albumin: 2.5 g/dL — ABNORMAL LOW (ref 3.5–5.0)
Alkaline Phosphatase: 87 U/L (ref 38–126)
Anion gap: 9 (ref 5–15)
BUN: 8 mg/dL (ref 6–20)
CO2: 22 mmol/L (ref 22–32)
Calcium: 9.2 mg/dL (ref 8.9–10.3)
Chloride: 105 mmol/L (ref 98–111)
Creatinine, Ser: 0.76 mg/dL (ref 0.44–1.00)
GFR, Estimated: >60 mL/min (ref >60)
Glucose, Bld: 97 mg/dL (ref 70–99)
Potassium: 3.6 mmol/L (ref 3.5–5.1)
Sodium: 136 mmol/L (ref 135–145)
Total Bilirubin: 0.4 mg/dL (ref <1.2)
Total Protein: 6.3 g/dL — ABNORMAL LOW (ref 6.5–8.1)

## 2022-12-10 LAB — LIPASE, BLOOD: Lipase: 29 U/L (ref 11–51)

## 2022-12-10 NOTE — MAU Provider Note (Addendum)
Abdominal Pain, Chest Tightness     S [name] is a 22 y.o. G2P0010 pregnant female at [redacted]w[redacted]d who presents to MAU today with complaint of new sharp intermittent abdominal pains and intermittent chest tightness. Pt states no sick contacts at home but she had diarrhea for the last couple of days.  However, she started having lateral side pains today with global tightness in her abdomin.  She states its irregular and a few minutes apart, unsure if they're contractions.  She states with the contraction like discomfort she also has chest tightness and nausea that last for about a minute each time.  Pt endorses increase in vaginal d/c as well but denies vaginal irritation. Denies SOB, numbness, weakness, change in urination, F/C.  Denies VB, LOF.  +FM.   Receives care at [facility]. Prenatal records reviewed.  Pertinent items noted in HPI and remainder of comprehensive ROS otherwise negative.   O BP (!) 121/58   Pulse 89   Temp 97.6 F (36.4 C) (Oral)   Resp 16   Ht 5\' 5"  (1.651 m)   Wt 126.6 kg   LMP 04/05/2022 (Exact Date)   SpO2 99%   BMI 46.44 kg/m  Physical Exam Vitals and nursing note reviewed.  Constitutional:      General: She is not in acute distress.    Appearance: She is well-developed. She is obese. She is not ill-appearing.  HENT:     Head: Normocephalic and atraumatic.  Eyes:     Extraocular Movements: Extraocular movements intact.  Cardiovascular:     Rate and Rhythm: Normal rate and regular rhythm.     Heart sounds: No murmur heard.    No friction rub. No gallop.  Pulmonary:     Effort: Pulmonary effort is normal.     Breath sounds: Normal breath sounds.  Abdominal:     General: Abdomen is flat. Bowel sounds are normal. There is no distension.     Palpations: Abdomen is soft.     Tenderness: There is no abdominal tenderness.     Comments: Gravid   Skin:    General: Skin is warm and dry.  Neurological:     Mental Status: She is alert and oriented to  person, place, and time.     Motor: No weakness.  Psychiatric:        Mood and Affect: Mood normal.        Behavior: Behavior normal.    Results for orders placed or performed during the hospital encounter of 12/10/22 (from the past 24 hour(s))  Urinalysis, Routine w reflex microscopic -Urine, Clean Catch     Status: None   Collection Time: 12/10/22  6:32 PM  Result Value Ref Range   Color, Urine YELLOW YELLOW   APPearance CLEAR CLEAR   Specific Gravity, Urine 1.006 1.005 - 1.030   pH 6.0 5.0 - 8.0   Glucose, UA NEGATIVE NEGATIVE mg/dL   Hgb urine dipstick NEGATIVE NEGATIVE   Bilirubin Urine NEGATIVE NEGATIVE   Ketones, ur NEGATIVE NEGATIVE mg/dL   Protein, ur NEGATIVE NEGATIVE mg/dL   Nitrite NEGATIVE NEGATIVE   Leukocytes,Ua NEGATIVE NEGATIVE  CBC with Differential/Platelet     Status: Abnormal   Collection Time: 12/10/22  7:50 PM  Result Value Ref Range   WBC 9.9 4.0 - 10.5 K/uL   RBC 3.53 (L) 3.87 - 5.11 MIL/uL   Hemoglobin 10.8 (L) 12.0 - 15.0 g/dL   HCT 14.7 (L) 82.9 - 56.2 %   MCV 92.4 80.0 -  100.0 fL   MCH 30.6 26.0 - 34.0 pg   MCHC 33.1 30.0 - 36.0 g/dL   RDW 82.9 (H) 56.2 - 13.0 %   Platelets 257 150 - 400 K/uL   nRBC 0.0 0.0 - 0.2 %   Neutrophils Relative % 66 %   Neutro Abs 6.5 1.7 - 7.7 K/uL   Lymphocytes Relative 23 %   Lymphs Abs 2.3 0.7 - 4.0 K/uL   Monocytes Relative 8 %   Monocytes Absolute 0.7 0.1 - 1.0 K/uL   Eosinophils Relative 2 %   Eosinophils Absolute 0.2 0.0 - 0.5 K/uL   Basophils Relative 0 %   Basophils Absolute 0.0 0.0 - 0.1 K/uL   Immature Granulocytes 1 %   Abs Immature Granulocytes 0.05 0.00 - 0.07 K/uL  Comprehensive metabolic panel     Status: Abnormal   Collection Time: 12/10/22  7:50 PM  Result Value Ref Range   Sodium 136 135 - 145 mmol/L   Potassium 3.6 3.5 - 5.1 mmol/L   Chloride 105 98 - 111 mmol/L   CO2 22 22 - 32 mmol/L   Glucose, Bld 97 70 - 99 mg/dL   BUN 8 6 - 20 mg/dL   Creatinine, Ser 8.65 0.44 - 1.00 mg/dL    Calcium 9.2 8.9 - 78.4 mg/dL   Total Protein 6.3 (L) 6.5 - 8.1 g/dL   Albumin 2.5 (L) 3.5 - 5.0 g/dL   AST 19 15 - 41 U/L   ALT 19 0 - 44 U/L   Alkaline Phosphatase 87 38 - 126 U/L   Total Bilirubin 0.4 <1.2 mg/dL   GFR, Estimated >69 >62 mL/min   Anion gap 9 5 - 15  Lipase, blood     Status: None   Collection Time: 12/10/22  7:50 PM  Result Value Ref Range   Lipase 29 11 - 51 U/L   DG Chest Port 1V same Day  Result Date: 12/10/2022 CLINICAL DATA:  Chest tightness EXAM: PORTABLE CHEST 1 VIEW COMPARISON:  07/02/2015 FINDINGS: Low lung volumes. Heart and mediastinal contours are within normal limits. No focal opacities or effusions. No acute bony abnormality. IMPRESSION: Low volumes.  No active cardiopulmonary disease. Electronically Signed   By: Charlett Nose M.D.   On: 12/10/2022 19:23      MDM: moderate MAU Course:  CBCdiff no leukocytosis, Hgb 10.8* UA noninfectious  CXR with low lung volumes but no active cardiopulmonary disease  A&P: #[redacted] weeks gestation #Abdominal pain, global #Intermittent chest tightness  Care transferred to [facility] at 2007 pending the following: - EKG - vaginal swabs  - pending CMP and Lipase labs as well   Pamela Dibble, MD 12/10/2022 7:04 PM   Reassessment (8:14 PM) -Provider to bedside to introduce self and reassess. -Patient reports pain is 3/10 and can increase to 5-6/10.  She states chest tightness, but that it is mainly in her back. She reports it starts at the start of her BH ctx and radiates up and into her back.  She states the symptoms resolves with contractions.  She also denies discharge of concern, but no irritation, odor, or itching.  She reports she had vaginal swabs 2 weeks ago.  -Reassured that symptoms are normal and discussed physiological changes that occur with gravid uterus and how this contributes to symptoms.  -EKG and cultures cancelled. -Reviewed labs and informed of somewhat low HgB, but reassured that PNV and diet should  help maintain level. -Discussed cervical exam to r/o PTL and patient  agreeable.  Exam performed and 0/50/Ballotable, Posterior, Soft -Will obtain reactive NST and discharge to home. -Patient scheduled for follow up on Monday.   Reassessment (8:51 PM) -NST reactive. -Provider to bedside and patient without questions. -Precautions reviewed. -Encouraged to call primary office or return to MAU if symptoms worsen or with the onset of new symptoms. -Discharged to home in stable condition.  Cherre Robins MSN, CNM Advanced Practice Provider, [facility] [employer]

## 2022-12-10 NOTE — MAU Note (Signed)
.  [name] is a 22 y.o. at [redacted]w[redacted]d here in MAU reporting: tight and sharp abdominal pain that started this morning and has continued to get worse - pain is constant. She also reports intermittent abdominal cramping that is irregular and sometimes comes minutes apart and other times comes hours apart. Denies VB or LOF. Reports +FM.  LMP: N/A Onset of complaint: This morning when she woke up Pain score: 6/10 Vitals:   12/10/22 1815  BP: (!) 116/50  Pulse: 90  Resp: 16  Temp: 97.6 F (36.4 C)  SpO2: 99%     FHT:140 Lab orders placed from triage:  UA

## 2022-12-12 ENCOUNTER — Ambulatory Visit (HOSPITAL_BASED_OUTPATIENT_CLINIC_OR_DEPARTMENT_OTHER): Payer: 59 | Admitting: Obstetrics & Gynecology

## 2022-12-12 ENCOUNTER — Telehealth: Payer: Self-pay | Admitting: Dietician

## 2022-12-12 VITALS — BP 134/60 | HR 92 | Wt 277.4 lb

## 2022-12-12 DIAGNOSIS — O0993 Supervision of high risk pregnancy, unspecified, third trimester: Secondary | ICD-10-CM | POA: Diagnosis not present

## 2022-12-12 DIAGNOSIS — O403XX0 Polyhydramnios, third trimester, not applicable or unspecified: Secondary | ICD-10-CM

## 2022-12-12 DIAGNOSIS — Z3A35 35 weeks gestation of pregnancy: Secondary | ICD-10-CM

## 2022-12-12 DIAGNOSIS — F50819 Binge eating disorder, unspecified: Secondary | ICD-10-CM

## 2022-12-12 DIAGNOSIS — Z87898 Personal history of other specified conditions: Secondary | ICD-10-CM

## 2022-12-12 DIAGNOSIS — Z794 Long term (current) use of insulin: Secondary | ICD-10-CM

## 2022-12-12 DIAGNOSIS — O360920 Maternal care for other rhesus isoimmunization, second trimester, not applicable or unspecified: Secondary | ICD-10-CM

## 2022-12-12 DIAGNOSIS — O0990 Supervision of high risk pregnancy, unspecified, unspecified trimester: Secondary | ICD-10-CM

## 2022-12-12 DIAGNOSIS — E1165 Type 2 diabetes mellitus with hyperglycemia: Secondary | ICD-10-CM

## 2022-12-12 DIAGNOSIS — F4010 Social phobia, unspecified: Secondary | ICD-10-CM

## 2022-12-12 NOTE — Telephone Encounter (Signed)
Patient returned my call.  She stated that she bolused for a snack last night which caused her to be low.  Because this is a rare occurrence, will maintain her ICR as she is frequently high post meal and it took 4 hours after her dinner meal bolus to lower her sensor reading.  Advised her to use judgement for a bolus for a snack before bed depending on her blood glucose and trend.  She is to contact me for further questions or concerns.  Oran Rein, RD, LDN, CDCES

## 2022-12-12 NOTE — Telephone Encounter (Signed)
Insulin pump:  Omnipod DASH Dexcom Clarity report reviewed.  Fasting glucose 75, Post meal glucose 245 this am and 2 lows with in the past 24 hours (51 and 63). Called patient.  She was not available.  Left a message stating that if lows were confirmed by fingerstick and she would like changes to contact me.   She is to be admitted for induction 12/13/2022. Left message that the inpatient diabetes team would follow her pump after admit.  Oran Rein, RD, LDN, CDCES

## 2022-12-12 NOTE — Progress Notes (Signed)
   PRENATAL VISIT NOTE  Subjective:  [name] is a 22 y.o. G2P0010 at [redacted]w[redacted]d being seen today for ongoing prenatal care.  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  Patient reports  she's been having contractions.  Did go to the MAU over the weekend .  Has reactive NST.    Had several lows over the past few days.  Also had elevated blood sugar this morning after eating honeycombs.  Contractions: Irregular. Vag. Bleeding: None.  Movement: Present. Denies leaking of fluid.   The following portions of the patient's history were reviewed and updated as appropriate: allergies, current medications, past family history, past medical history, past social history, past surgical history and problem list.   Objective:   Vitals:   12/12/22 1512  BP: 134/60  Pulse: 92  Weight: 277 lb 6.4 oz (125.8 kg)    Fetal Status: Fetal Heart Rate (bpm): 130   Movement: Present     General:  Alert, oriented and cooperative. Patient is in no acute distress.  Skin: Skin is warm and dry. No rash noted.   Cardiovascular: Normal heart rate noted  Respiratory: Normal respiratory effort, no problems with respiration noted  Abdomen: Soft, gravid, appropriate for gestational age.  Pain/Pressure: Present     Pelvic: Cervical exam performed in the presence of a chaperone        Extremities: Normal range of motion.   Edema: None  Mental Status: Normal mood and affect. Normal behavior. Normal judgment and thought content.   Assessment and Plan:  Pregnancy: G2P0010 at [redacted]w[redacted]d 1. Supervision of high risk pregnancy, antepartum - on PNV and baby ASA  2. [redacted] weeks gestation of pregnancy - induction is scheduled for tomorrow.  Reasons to go to the MAU sooner disucssed.  3. Type 2 diabetes mellitus with hyperglycemia, with long-term current use of insulin (HCC) - on insulin pump with basal rate decreased by Oran Rein this morning.  Vernona Rieger has reached out to inpatient team about pt. - NST reactive today - ultrasound last Friday showed resolution of her IUGR  4. Social anxiety disorder  5. History of tachycardia  6. Binge eating disorder, unspecified severity  7. 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  This has been positive but very low titer  8. Polyhydramnios affecting pregnancy in third trimester   Preterm labor symptoms and general obstetric precautions including but not limited to vaginal bleeding, contractions, leaking of fluid and fetal movement were reviewed in detail with the patient. Please refer to After Visit Summary for other counseling recommendations.   No follow-ups on file.  Future Appointments  Date Time Provider Department Center  12/13/2022  6:45 AM [facility] SCHED ROOM [facility] None  12/22/2022 10:00 AM Tobb, Lavona Mound, DO [facility] None    Jerene Bears, MD

## 2022-12-13 ENCOUNTER — Encounter (HOSPITAL_COMMUNITY): Payer: Self-pay | Admitting: Obstetrics & Gynecology

## 2022-12-13 ENCOUNTER — Inpatient Hospital Stay (HOSPITAL_COMMUNITY): Payer: 59

## 2022-12-13 ENCOUNTER — Inpatient Hospital Stay (HOSPITAL_COMMUNITY): Payer: 59 | Admitting: Anesthesiology

## 2022-12-13 ENCOUNTER — Other Ambulatory Visit: Payer: Self-pay

## 2022-12-13 ENCOUNTER — Encounter (HOSPITAL_BASED_OUTPATIENT_CLINIC_OR_DEPARTMENT_OTHER): Payer: 59 | Admitting: Obstetrics & Gynecology

## 2022-12-13 ENCOUNTER — Inpatient Hospital Stay (HOSPITAL_COMMUNITY)
Admission: RE | Admit: 2022-12-13 | Discharge: 2022-12-17 | DRG: 787 | Disposition: A | Discharge status: Home / Self Care | Payer: 59 | Attending: Obstetrics & Gynecology | Admitting: Obstetrics & Gynecology

## 2022-12-13 DIAGNOSIS — O9902 Anemia complicating childbirth: Secondary | ICD-10-CM | POA: Diagnosis present

## 2022-12-13 DIAGNOSIS — O99284 Endocrine, nutritional and metabolic diseases complicating childbirth: Secondary | ICD-10-CM | POA: Diagnosis present

## 2022-12-13 DIAGNOSIS — O99210 Obesity complicating pregnancy, unspecified trimester: Secondary | ICD-10-CM | POA: Diagnosis present

## 2022-12-13 DIAGNOSIS — O360920 Maternal care for other rhesus isoimmunization, second trimester, not applicable or unspecified: Secondary | ICD-10-CM | POA: Diagnosis present

## 2022-12-13 DIAGNOSIS — Z5986 Financial insecurity: Secondary | ICD-10-CM

## 2022-12-13 DIAGNOSIS — Z7982 Long term (current) use of aspirin: Secondary | ICD-10-CM | POA: Diagnosis not present

## 2022-12-13 DIAGNOSIS — O99892 Other specified diseases and conditions complicating childbirth: Secondary | ICD-10-CM | POA: Diagnosis present

## 2022-12-13 DIAGNOSIS — E1165 Type 2 diabetes mellitus with hyperglycemia: Secondary | ICD-10-CM | POA: Diagnosis present

## 2022-12-13 DIAGNOSIS — O99214 Obesity complicating childbirth: Secondary | ICD-10-CM | POA: Diagnosis present

## 2022-12-13 DIAGNOSIS — E66813 Obesity, class 3: Secondary | ICD-10-CM | POA: Diagnosis present

## 2022-12-13 DIAGNOSIS — E282 Polycystic ovarian syndrome: Secondary | ICD-10-CM | POA: Diagnosis present

## 2022-12-13 DIAGNOSIS — F3162 Bipolar disorder, current episode mixed, moderate: Secondary | ICD-10-CM | POA: Diagnosis present

## 2022-12-13 DIAGNOSIS — N7011 Chronic salpingitis: Principal | ICD-10-CM

## 2022-12-13 DIAGNOSIS — Z794 Long term (current) use of insulin: Secondary | ICD-10-CM | POA: Diagnosis not present

## 2022-12-13 DIAGNOSIS — Z833 Family history of diabetes mellitus: Secondary | ICD-10-CM | POA: Diagnosis not present

## 2022-12-13 DIAGNOSIS — O24113 Pre-existing diabetes mellitus, type 2, in pregnancy, third trimester: Secondary | ICD-10-CM | POA: Diagnosis present

## 2022-12-13 DIAGNOSIS — O360930 Maternal care for other rhesus isoimmunization, third trimester, not applicable or unspecified: Secondary | ICD-10-CM | POA: Diagnosis present

## 2022-12-13 DIAGNOSIS — Z3A36 36 weeks gestation of pregnancy: Secondary | ICD-10-CM

## 2022-12-13 DIAGNOSIS — O365930 Maternal care for other known or suspected poor fetal growth, third trimester, not applicable or unspecified: Secondary | ICD-10-CM | POA: Diagnosis not present

## 2022-12-13 DIAGNOSIS — Z79899 Other long term (current) drug therapy: Secondary | ICD-10-CM | POA: Diagnosis not present

## 2022-12-13 DIAGNOSIS — E66812 Obesity, class 2: Secondary | ICD-10-CM | POA: Diagnosis present

## 2022-12-13 DIAGNOSIS — Z3490 Encounter for supervision of normal pregnancy, unspecified, unspecified trimester: Principal | ICD-10-CM

## 2022-12-13 DIAGNOSIS — Z7984 Long term (current) use of oral hypoglycemic drugs: Secondary | ICD-10-CM | POA: Diagnosis not present

## 2022-12-13 DIAGNOSIS — R000 Tachycardia, unspecified: Secondary | ICD-10-CM | POA: Diagnosis present

## 2022-12-13 DIAGNOSIS — O24414 Gestational diabetes mellitus in pregnancy, insulin controlled: Principal | ICD-10-CM | POA: Diagnosis present

## 2022-12-13 DIAGNOSIS — O403XX0 Polyhydramnios, third trimester, not applicable or unspecified: Secondary | ICD-10-CM | POA: Diagnosis present

## 2022-12-13 DIAGNOSIS — Z98891 History of uterine scar from previous surgery: Secondary | ICD-10-CM

## 2022-12-13 DIAGNOSIS — O34211 Maternal care for low transverse scar from previous cesarean delivery: Secondary | ICD-10-CM | POA: Diagnosis present

## 2022-12-13 DIAGNOSIS — O24424 Gestational diabetes mellitus in childbirth, insulin controlled: Secondary | ICD-10-CM | POA: Diagnosis not present

## 2022-12-13 DIAGNOSIS — O0990 Supervision of high risk pregnancy, unspecified, unspecified trimester: Secondary | ICD-10-CM

## 2022-12-13 DIAGNOSIS — O2412 Pre-existing diabetes mellitus, type 2, in childbirth: Secondary | ICD-10-CM | POA: Diagnosis present

## 2022-12-13 DIAGNOSIS — Z8249 Family history of ischemic heart disease and other diseases of the circulatory system: Secondary | ICD-10-CM | POA: Diagnosis not present

## 2022-12-13 LAB — RPR: RPR Ser Ql: NONREACTIVE

## 2022-12-13 LAB — CBC
HCT: 33.7 % — ABNORMAL LOW (ref 36.0–46.0)
Hemoglobin: 11.1 g/dL — ABNORMAL LOW (ref 12.0–15.0)
MCH: 30.2 pg (ref 26.0–34.0)
MCHC: 32.9 g/dL (ref 30.0–36.0)
MCV: 91.6 fL (ref 80.0–100.0)
Platelets: 243 10*3/uL (ref 150–400)
RBC: 3.68 MIL/uL — ABNORMAL LOW (ref 3.87–5.11)
RDW: 16.1 % — ABNORMAL HIGH (ref 11.5–15.5)
WBC: 9.5 10*3/uL (ref 4.0–10.5)
nRBC: 0 % (ref 0.0–0.2)

## 2022-12-13 LAB — GLUCOSE, CAPILLARY: Glucose-Capillary: 99 mg/dL (ref 70–99)

## 2022-12-13 MED ORDER — LACTATED RINGERS IV SOLN
500.0000 mL | INTRAVENOUS | Status: AC | PRN
  Administered 2022-12-14: 500 mL via INTRAVENOUS

## 2022-12-13 MED ORDER — MISOPROSTOL 50MCG HALF TABLET
50.0000 ug | ORAL_TABLET | Freq: Once | ORAL | Status: AC
  Administered 2022-12-13: 50 ug via BUCCAL
  Filled 2022-12-13: qty 1

## 2022-12-13 MED ORDER — OXYTOCIN-SODIUM CHLORIDE 30-0.9 UT/500ML-% IV SOLN
1.0000 m[IU]/min | INTRAVENOUS | Status: DC
  Administered 2022-12-13: 2 m[IU]/min via INTRAVENOUS
  Administered 2022-12-14: 4 m[IU]/min via INTRAVENOUS
  Administered 2022-12-14: 6 m[IU]/min via INTRAVENOUS
  Administered 2022-12-14: 8 m[IU]/min via INTRAVENOUS
  Filled 2022-12-13: qty 500

## 2022-12-13 MED ORDER — INSULIN PUMP
SUBCUTANEOUS | Status: DC
  Administered 2022-12-13: 3 via SUBCUTANEOUS
  Administered 2022-12-16: 8.25 via SUBCUTANEOUS
  Filled 2022-12-13: qty 1

## 2022-12-13 MED ORDER — EPHEDRINE 5 MG/ML INJ
10.0000 mg | INTRAVENOUS | Status: DC | PRN
Start: 2022-12-13 — End: 2022-12-15

## 2022-12-13 MED ORDER — DIPHENHYDRAMINE HCL 50 MG/ML IJ SOLN: 12.5000 mg | INTRAMUSCULAR | Status: DC | PRN

## 2022-12-13 MED ORDER — MISOPROSTOL 25 MCG QUARTER TABLET
25.0000 ug | ORAL_TABLET | ORAL | Status: DC
  Administered 2022-12-13: 25 ug via VAGINAL
  Filled 2022-12-13: qty 1

## 2022-12-13 MED ORDER — OXYCODONE-ACETAMINOPHEN 5-325 MG PO TABS: 1.0000 | ORAL_TABLET | ORAL | Status: DC | PRN

## 2022-12-13 MED ORDER — OXYTOCIN-SODIUM CHLORIDE 30-0.9 UT/500ML-% IV SOLN: 2.5000 [IU]/h | INTRAVENOUS | Status: DC

## 2022-12-13 MED ORDER — LIDOCAINE HCL (PF) 1 % IJ SOLN: 30.0000 mL | INTRAMUSCULAR | Status: DC | PRN

## 2022-12-13 MED ORDER — LACTATED RINGERS IV SOLN: INTRAVENOUS | Status: AC

## 2022-12-13 MED ORDER — LACTATED RINGERS IV SOLN
500.0000 mL | Freq: Once | INTRAVENOUS | Status: AC
  Administered 2022-12-13: 500 mL via INTRAVENOUS

## 2022-12-13 MED ORDER — FLEET ENEMA RE ENEM: 1.0000 | ENEMA | RECTAL | Status: DC | PRN

## 2022-12-13 MED ORDER — SOD CITRATE-CITRIC ACID 500-334 MG/5ML PO SOLN
30.0000 mL | ORAL | Status: DC | PRN
  Filled 2022-12-13 (×2): qty 30

## 2022-12-13 MED ORDER — ACETAMINOPHEN 325 MG PO TABS
650.0000 mg | ORAL_TABLET | ORAL | Status: DC | PRN
Start: 2022-12-13 — End: 2022-12-15
  Administered 2022-12-14 (×2): 650 mg via ORAL
  Filled 2022-12-13 (×2): qty 2

## 2022-12-13 MED ORDER — PHENYLEPHRINE 80 MCG/ML (10ML) SYRINGE FOR IV PUSH (FOR BLOOD PRESSURE SUPPORT)
80.0000 ug | PREFILLED_SYRINGE | INTRAVENOUS | Status: DC | PRN
Start: 2022-12-13 — End: 2022-12-15

## 2022-12-13 MED ORDER — OXYTOCIN BOLUS FROM INFUSION: 333.0000 mL | Freq: Once | INTRAVENOUS | Status: DC

## 2022-12-13 MED ORDER — TERBUTALINE SULFATE 1 MG/ML IJ SOLN
0.2500 mg | Freq: Once | INTRAMUSCULAR | Status: DC | PRN
Start: 2022-12-13 — End: 2022-12-15

## 2022-12-13 MED ORDER — MISOPROSTOL 50MCG HALF TABLET: 50.0000 ug | ORAL_TABLET | Freq: Once | ORAL | Status: DC

## 2022-12-13 MED ORDER — METOPROLOL SUCCINATE ER 50 MG PO TB24
50.0000 mg | ORAL_TABLET | Freq: Every day | ORAL | Status: DC
  Administered 2022-12-14: 50 mg via ORAL
  Filled 2022-12-13 (×2): qty 1

## 2022-12-13 MED ORDER — ONDANSETRON HCL 4 MG/2ML IJ SOLN
4.0000 mg | Freq: Four times a day (QID) | INTRAMUSCULAR | Status: DC | PRN
  Administered 2022-12-14 (×2): 4 mg via INTRAVENOUS
  Filled 2022-12-13 (×2): qty 2

## 2022-12-13 MED ORDER — FENTANYL-BUPIVACAINE-NACL 0.5-0.125-0.9 MG/250ML-% EP SOLN
12.0000 mL/h | EPIDURAL | Status: DC | PRN
  Administered 2022-12-13 – 2022-12-14 (×2): 12 mL/h via EPIDURAL
  Filled 2022-12-13 (×2): qty 250

## 2022-12-13 MED ORDER — OXYCODONE-ACETAMINOPHEN 5-325 MG PO TABS: 2.0000 | ORAL_TABLET | ORAL | Status: DC | PRN

## 2022-12-13 MED ORDER — METFORMIN HCL 500 MG PO TABS
1000.0000 mg | ORAL_TABLET | Freq: Two times a day (BID) | ORAL | Status: DC
  Administered 2022-12-13 – 2022-12-15 (×3): 1000 mg via ORAL
  Filled 2022-12-13 (×7): qty 2

## 2022-12-13 MED ORDER — LIDOCAINE HCL (PF) 1 % IJ SOLN
INTRAMUSCULAR | Status: DC | PRN
  Administered 2022-12-13 (×2): 4 mL via EPIDURAL

## 2022-12-13 MED ORDER — FENTANYL CITRATE (PF) 100 MCG/2ML IJ SOLN: 100.0000 ug | INTRAMUSCULAR | Status: DC | PRN

## 2022-12-13 MED ORDER — PHENYLEPHRINE 80 MCG/ML (10ML) SYRINGE FOR IV PUSH (FOR BLOOD PRESSURE SUPPORT): 80.0000 ug | PREFILLED_SYRINGE | INTRAVENOUS | Status: DC | PRN

## 2022-12-13 NOTE — H&P (Addendum)
[name] is a 22 y.o. G2P0010 female at [redacted]w[redacted]d by LMP c/w 6 week ultrasound presenting for IOL due to T2DM, Polyhydramnios, FGR (resolved) with elevated dopplers. Reports active fetal movement, contractions: none, vaginal bleeding: none, membranes: intact. Initiated prenatal care at [facility] at 6 wks.  Most recent u/s 12/09/2022 Posterior Placenta, AFI 35.58 HC 12% AC 30% EFW 2434 (23%). Elevated Umbilical artery dopplers   This pregnancy complicated by: Type 2 DM on insulin and metformin  PCOS Tachycardia during pregnancy on Toprol 50 Binge Eating Disorder Obesity Anti-E isoimmunizations (weak titer) Bipolar Disorder - not currently on meds  Hypercortisolism Polyhydramnios Poor fetal growth   NURSING  PROVIDER  Office Location [facility] Dating by LMP c/w U/S at 6+6 wks  [facility] Model Traditional Anatomy U/S 8/15  Initiated care at  [facility]                 Language  English               LAB RESULTS   Support Person Greig Castilla [facility] NIPS: LR/Female AFP: neg      NT/IT (FT only)        Carrier Screen Horizon: neg 4/4  Rhogam  O/Positive/-- (06/12 1619) A1C/GTT Early: 5.3 Third trimester: not done as blood sugar testing was elevated  Flu Vaccine 10/31/2022      TDaP Vaccine  10/18/2022 Blood Type O/Positive/-- (06/12 1619)  Covid Vaccine declined Antibody Positive, See Final Results (06/12 1619)  Anti E       Rubella 2.60 (06/12 1619)  Feeding Plan   Breast pump RPR Non Reactive (06/12 1619)  Contraception   POP HBsAg Negative (06/12 1619)  Circumcision N/a HIV Non Reactive (06/12 1619)  Pediatrician  [facility] HCVAb  Negative  Prenatal Classes Info given          Pap       Diagnosis  Date Value Ref Range Status  04/11/2022 (A)   Final    - Atypical squamous cells of undetermined significance (ASC-US)    BTLConsent   GC/CT Initial:  neg/neg 36wks:  neg/neg  VBAC  Consent   GBS For PCN allergy, check sensitivities            DME Rx [x]  BP cuff [ ]  Weight  Scale Waterbirth  [ ]  Class [ ]  Consent [ ]  CNM visit  PHQ9 & GAD7 [x]  new OB Arly.Keller  ] 28 weeks  [ x] 36 weeks Induction  [ ]  Orders Entered [ ] Foley Y/N    Past Medical History: Past Medical History:  Diagnosis Date   Acquired acanthosis nigricans    Anxiety    Bipolar affective (HCC)    with depression and anxiety.   Chronic headaches 09/10/2013   Constipation 11/13/2014   Deliberate self-cutting    last done 1 month ago to left arm   Depression    Diabetes mellitus without complication (HCC)    Type II   Dyspepsia    Episodic mood disorder (HCC) 07/12/2013   Goiter    Hidradenitis 07/24/2022   Uses topical clindamycin solution  Needed abx (keflex) for furuncle on 7/19     Menorrhagia    required transfusion in 04/2013   Morbid obesity (HCC) 04/30/2013   Non-alcoholic fatty liver disease 11/13/2013   Obesity    Polycystic ovary disease    Reflux    Tachycardia    Transfusion history    Vitamin D deficiency 11/13/2013    Past Surgical History:  Past Surgical History:  Procedure Laterality Date   TONSILLECTOMY Bilateral 2018   TYMPANOSTOMY TUBE PLACEMENT      Obstetrical History: OB History     Gravida  2   Para      Term      Preterm      AB  1   Living         SAB  1   IAB      Ectopic      Multiple      Live Births              Social History: Social History   Socioeconomic History   Marital status: Married    Spouse name: Not on file   Number of children: 0   Years of education: Not on file   Highest education level: Not on file  Occupational History   Not on file  Tobacco Use   Smoking status: Never   Smokeless tobacco: Never  Vaping Use   Vaping status: Former   Quit date: 04/09/2022  Substance and Sexual Activity   Alcohol use: No    Alcohol/week: 0.0 standard drinks of alcohol   Drug use: No   Sexual activity: Not Currently    Partners: Male  Other Topics Concern   Not on file  Social History Narrative   Family  history includes mother with a hysterectomy due to bleeding after birth of second child.   Social Determinants of Health   Financial Resource Strain: Medium Risk (06/15/2022)   Overall Financial Resource Strain (CARDIA)    Difficulty of Paying Living Expenses: Somewhat hard  Food Insecurity: No Food Insecurity (06/15/2022)   Hunger Vital Sign    Worried About Running Out of Food in the Last Year: Never true    Ran Out of Food in the Last Year: Never true  Transportation Needs: No Transportation Needs (06/15/2022)   PRAPARE - Administrator, Civil Service (Medical): No    Lack of Transportation (Non-Medical): No  Physical Activity: Inactive (06/15/2022)   Exercise Vital Sign    Days of Exercise per Week: 0 days    Minutes of Exercise per Session: 0 min  Stress: No Stress Concern Present (06/15/2022)   [employer] of Occupational Health - Occupational Stress Questionnaire    Feeling of Stress : Only a little  Social Connections: Moderately Isolated (06/15/2022)   Social Connection and Isolation Panel [NHANES]    Frequency of Communication with Friends and Family: More than three times a week    Frequency of Social Gatherings with Friends and Family: Once a week    Attends Religious Services: Never    Database administrator or Organizations: No    Attends Engineer, structural: Never    Marital Status: Married    Family History: Family History  Problem Relation Age of Onset   Diabetes Mother    Diabetes Father    Bipolar disorder Father    Heart disease Father 51       AMI   Heart attack Father    Diabetes Maternal Aunt    Diabetes Maternal Uncle    Heart disease Maternal Uncle    Cancer Paternal Aunt        lung metastatic to breat and liver   Cancer Maternal Grandmother 68       lung cancer   Hypertension Maternal Grandmother    Hypertension Maternal Grandfather    Diabetes Maternal Grandfather  Heart disease Maternal Grandfather     Schizophrenia Paternal Grandmother     Allergies: No Known Allergies  Medications Prior to Admission  Medication Sig Dispense Refill Last Dose   aspirin EC 81 MG tablet Take 1 tablet (81 mg total) by mouth daily. Swallow whole.      clindamycin (CLEOCIN T) 1 % external solution Apply topically 2 (two) times daily. 30 mL 1    Continuous Glucose Sensor (DEXCOM G7 SENSOR) MISC Please dispense 30 day supply of dexcom 7 sensors.  3 sensors. 3 each 6    insulin aspart (NOVOLOG) 100 UNIT/ML injection Use 120 units daily with insulin pump with max daily amount of 140 units daily 50 mL 3    Insulin Disposable Pump (OMNIPOD DASH PODS, GEN 4,) MISC Please dispense 30 day supply of omnipods.  Pt is using between 115 - 140 each day.  Pt currently using up to 21 pods each month. 20 each 2    metFORMIN (GLUCOPHAGE) 500 MG tablet Take 2 tablets (1,000 mg total) by mouth 2 (two) times daily with a meal. 120 tablet 1    metoprolol succinate (TOPROL-XL) 50 MG 24 hr tablet Take 1 tablet (50 mg total) by mouth daily. 90 tablet 2    ondansetron (ZOFRAN-ODT) 4 MG disintegrating tablet Take 1 tablet (4 mg total) by mouth every 8 (eight) hours as needed for nausea or vomiting. 30 tablet 0    Prenatal Vit-Fe Fumarate-FA (MULTIVITAMIN-PRENATAL) 27-0.8 MG TABS tablet Take 1 tablet by mouth daily at 12 noon.       Review of Systems  Pertinent pos/neg as indicated in HPI  Height 5\' 5"  (1.651 m), weight 126.4 kg, last menstrual period 04/05/2022. General appearance: alert, cooperative, and no distress Lungs: Speaks in full sentences, comfortable work of breathing, no audible wheeze  Heart: regular rate and rhythm, appears well perfused  Abdomen: gravid, soft, non-tender,  Extremities: no edema   Fetal monitoring: FHR: 130 bpm, variability: moderate,  Accelerations: Present,  decelerations:  Absent Uterine activity: None   Presentation: cephalic   Prenatal labs: ABO, Rh: O/Positive/-- (06/12 1619) Antibody:  Positive, See Final Results (11/20 0856) Rubella: 2.60 (06/12 1619) RPR: Non Reactive (10/16 1625)  HBsAg: Negative (06/12 1619)  HIV: Non Reactive (10/16 1625)  GBS: Negative/-- (11/20 0419)  A1C: 5.3  NIPS LR Female AFP Neg  Prenatal Transfer Tool  Maternal Diabetes: Yes:  Diabetes Type:  Pre-pregnancy, Insulin/Medication controlled Genetic Screening: Normal Maternal Ultrasounds/Referrals: IUGR, FGR  Fetal Ultrasounds or other Referrals:  Referred to Materal Fetal Medicine  Maternal Substance Abuse:  No Significant Maternal Medications:  Insulin, metformin Significant Maternal Lab Results: Group B Strep negative and Other:  Anti E  No results found for this or any previous visit (from the past 24 hour(s)).   Assessment:  [redacted]w[redacted]d SIUP  G2P0010  Cat 1 FHR  GBS Negative/-- (11/20 0419)  Plan:  Admit to L&D, start with single dose cytotec given hx FGR, elevated dopplers   IV pain meds/epidural patient's choice   Anticipate SVD   Plans to breast feed  Contraception: POPs   #Polyhydramnios # T2DM: Basal insulin 2.5u / hr, total 60u daily. Metformin dose BID last dose AM 12/10. Plan CBGs q4 hour while in latent labor, Ok to use dexcom if calibrated: Most recent u/s 12/09/2022 Posterior Placenta, AFI 35.58 HC 12% AC 30% EFW 2434 (23%). Elevated Umbilical artery dopplers  - DM coordinator to see patient IP - Insulin pump, continue home metformin    #  Anti-E isoimmunization, noted in 2nd trimester #Elevated dopplers # Poor fetal growth - recommended NICU at delivery   #Hydrosalpinx: noted on viability scan   #Maternal hx of tachycardia: echo per patient, wore monitor, on metroprolol XL with control os symptoms - continue home metroprolol  Nelta Numbers MD  Visiting Resident PGY3 - Family Medicine  12/13/2022, 6:24 AM  Evaluation and management procedures were performed by the [facility] Medicine Resident under my supervision. I was immediately available for direct supervision,  assistance and direction throughout this encounter.  I also confirm that I have verified the information documented in the resident's note, and that I have also personally reperformed the pertinent components of the physical exam and all of the medical decision making activities.  I have also made any necessary editorial changes.   Mittie Bodo, MD Family Medicine - Obstetrics Fellow

## 2022-12-13 NOTE — Progress Notes (Signed)
Diabetic Coordinator Lujean Rave, RN has been notified of patient's admission and will see patient in house this morning.

## 2022-12-13 NOTE — Anesthesia Procedure Notes (Signed)
Epidural Patient location during procedure: OB Start time: 12/13/2022 9:25 PM End time: 12/13/2022 9:29 PM  Staffing Anesthesiologist: Kaylyn Layer, MD Performed: anesthesiologist   Preanesthetic Checklist Completed: patient identified, IV checked, risks and benefits discussed, monitors and equipment checked, pre-op evaluation and timeout performed  Epidural Patient position: sitting Prep: DuraPrep and site prepped and draped Patient monitoring: continuous pulse ox, blood pressure and heart rate Approach: midline Location: L3-L4 Injection technique: LOR air  Needle:  Needle type: Tuohy  Needle gauge: 17 G Needle length: 9 cm Needle insertion depth: 7 cm Catheter type: closed end flexible Catheter size: 19 Gauge Catheter at skin depth: 12 cm Test dose: negative and Other (1% lidocaine)  Assessment Events: blood not aspirated, no cerebrospinal fluid, injection not painful, no injection resistance, no paresthesia and negative IV test  Additional Notes Patient identified. Risks, benefits, and alternatives discussed with patient including but not limited to bleeding, infection, nerve damage, paralysis, failed block, incomplete pain control, headache, blood pressure changes, nausea, vomiting, reactions to medication, itching, and postpartum back pain. Confirmed with bedside nurse the patient's most recent platelet count. Confirmed with patient that they are not currently taking any anticoagulation, have any bleeding history, or any family history of bleeding disorders. Patient expressed understanding and wished to proceed. All questions were answered. Sterile technique was used throughout the entire procedure. Please see nursing notes for vital signs.   Crisp LOR after 2 needle redirections. Test dose was given through epidural catheter and negative prior to continuing to dose epidural or start infusion. Warning signs of high block given to the patient including shortness of breath,  tingling/numbness in hands, complete motor block, or any concerning symptoms with instructions to call for help. Patient was given instructions on fall risk and not to get out of bed. All questions and concerns addressed with instructions to call with any issues or inadequate analgesia.  Reason for block:procedure for pain

## 2022-12-13 NOTE — Anesthesia Preprocedure Evaluation (Signed)
Anesthesia Evaluation  Patient identified by MRN, date of birth, ID band Patient awake    Reviewed: Allergy & Precautions, Patient's Chart, lab work & pertinent test results  History of Anesthesia Complications Negative for: history of anesthetic complications  Airway Mallampati: II  TM Distance: >3 FB Neck ROM: Full    Dental no notable dental hx.    Pulmonary neg pulmonary ROS   Pulmonary exam normal        Cardiovascular negative cardio ROS Normal cardiovascular exam     Neuro/Psych  Headaches  Anxiety Depression Bipolar Disorder      GI/Hepatic negative GI ROS, Neg liver ROS,,,  Endo/Other  diabetes, Type 2, Insulin Dependent  Class 3 obesity  Renal/GU negative Renal ROS  negative genitourinary   Musculoskeletal negative musculoskeletal ROS (+)    Abdominal   Peds  Hematology  (+) Blood dyscrasia (Hgb 11.1), anemia   Anesthesia Other Findings Day of surgery medications reviewed with patient.  Reproductive/Obstetrics (+) Pregnancy                             Anesthesia Physical Anesthesia Plan  ASA: 3  Anesthesia Plan: Epidural   Post-op Pain Management:    Induction:   PONV Risk Score and Plan: Treatment may vary due to age or medical condition  Airway Management Planned: Natural Airway  Additional Equipment: Fetal Monitoring  Intra-op Plan:   Post-operative Plan:   Informed Consent: I have reviewed the patients History and Physical, chart, labs and discussed the procedure including the risks, benefits and alternatives for the proposed anesthesia with the patient or authorized representative who has indicated his/her understanding and acceptance.       Plan Discussed with:   Anesthesia Plan Comments:        Anesthesia Quick Evaluation

## 2022-12-13 NOTE — Progress Notes (Signed)
Labor Progress Note  [name] is a 22 y.o. G2P0010 at [redacted]w[redacted]d presented for  IOL 2/2 T2DM, Polyhydramnios, FGR (resolved) with elevated dopplers   S: Pain improved with epidural, still feeling some pain.   O:  BP (!) 147/68   Pulse (!) 103   Temp 97.7 F (36.5 C) (Oral)   Resp 16   Ht 5\' 5"  (1.651 m)   Wt 126.4 kg   LMP 04/05/2022 (Exact Date)   SpO2 98%   BMI 46.38 kg/m  EFM:140 bpm/Moderate variability/ 15x15 accels/ None decels CAT: 1 Toco: irregular, every 2-5  minutes   CVE: Dilation: 4 Effacement (%): 40 Cervical Position: Middle Station: Ballotable Presentation: Vertex Exam by:: [name], MD   A&P: 22 y.o. G2P0010 [redacted]w[redacted]d  here for IOL 2/2 T2DM, Polyhydramnios, FGR (resolved) with elevated dopplers  as above    Labor Progress Note [name] is a 22 y.o. G2P0010 at [redacted]w[redacted]d presented for IOL 2/2 T2DM, Polyhydramnios, FGR (resolved) with elevated dopplers    S: Patient doing well, uncomfortable due to ctx. Support persons at bedside. No concerns.   O:  BP 129/64   Pulse 89   Temp 98.1 F (36.7 C) (Oral)   Resp 16   Ht 5\' 5"  (1.651 m)   Wt 126.4 kg   LMP 04/05/2022 (Exact Date)   BMI 46.38 kg/m  EFM: 135 bpm/Mod variability/+ Accels/No decels   CVE: Dilation: 2 Effacement (%): 50 Cervical Position: Middle Station: -3, -2 Presentation: Vertex Exam by:: Dr. Sherron Ales     A&P: 22 y.o. G2P0010 [redacted]w[redacted]d here for IOL as above   #Labor: Progressing well. Foley bulb out at 2240. Baby ballotable at next check, will start pitocin 2x 2.  #Pain: Epidural in place  #FWB: Cat 1 #GBS negative   #Polyhydramnios # T2DM: Home regimen: Basal insulin 2.5u / hr, total 60u daily. Metformin 1g BID - CBGs q4 hour while in latent labor - DM coordinator: Decreased basal to 1.25u/hr - Insulin pump, continue home metformin      #Anti-E isoimmunization, noted in 2nd trimester #Elevated dopplers # Poor fetal growth - recommended NICU at delivery     #Hydrosalpinx: noted on viability scan    #Maternal hx of tachycardia: echo per patient, wore monitor, on metroprolol XL with control os symptoms - continue home metroprolol      Nelta Numbers, MD Visiting Resident PGY3 - Family Medicine  [facility], Center for [facility] 12/13/22  10:57 PM

## 2022-12-13 NOTE — Progress Notes (Addendum)
Labor Progress Note [name] is a 22 y.o. G2P0010 at [redacted]w[redacted]d presented for IOL 2/2 T2DM, Polyhydramnios, FGR (resolved) with elevated dopplers   S: Patient doing well, uncomfortable due to ctx. Support persons at bedside. No concerns.  O:  BP 129/64   Pulse 89   Temp 98.1 F (36.7 C) (Oral)   Resp 16   Ht 5\' 5"  (1.651 m)   Wt 126.4 kg   LMP 04/05/2022 (Exact Date)   BMI 46.38 kg/m  EFM: 135 bpm/Mod variability/+ Accels/No decels  CVE: Dilation: 2 Effacement (%): 50 Cervical Position: Middle Station: -3, -2 Presentation: Vertex Exam by:: Dr. Sherron Ales   A&P: 22 y.o. G2P0010 [redacted]w[redacted]d here for IOL as above  #Labor: Progressing well. Foley bulb placed, 40cc. Tolerated well.   #Pain: Per patient request #FWB: Cat 1 #GBS negative  #Polyhydramnios # T2DM: Home regimen: Basal insulin 2.5u / hr, total 60u daily. Metformin 1g BID - CBGs q4 hour while in latent labor - DM coordinator: Decreased basal to 1.25u/hr - Insulin pump, continue home metformin   - glucose 99 > 50 per dexcom while in room, pt reduced insulin to half dose given that reading per DM coordinator instructions, drank apple juice with up trend noted prior to leaving room, pt remained asymptomatic    #Anti-E isoimmunization, noted in 2nd trimester #Elevated dopplers # Poor fetal growth - recommended NICU at delivery    #Hydrosalpinx: noted on viability scan    #Maternal hx of tachycardia: echo per patient, wore monitor, on metroprolol XL with control os symptoms - continue home metroprolol  [name], MD 6:55 PM   Evaluation and management procedures were performed by the [facility] Medicine Resident under my supervision. I was immediately available for direct supervision, assistance and direction throughout this encounter.  I also confirm that I have verified the information documented in the resident's note, and that I have also personally reperformed the pertinent components of the physical exam and all  of the medical decision making activities.  I have also made any necessary editorial changes.   Mittie Bodo, MD Family Medicine - Obstetrics Fellow

## 2022-12-13 NOTE — Progress Notes (Signed)
Baby continues to be difficult to trace due to fetal movement, patient habitus, and patient position. Will continue to adjust monitors as needed to attempt continuous FHR tracing.

## 2022-12-13 NOTE — Inpatient Diabetes Management (Signed)
Inpatient Diabetes Program Recommendations  Diabetes Treatment Program Recommendations  ADA Standards of Care Diabetes in Pregnancy Target Glucose Ranges:  Fasting: 70 - 95 mg/dL 1 hr postprandial: Less than 140mg /dL (from first bite of meal) 2 hr postprandial: Less than 120 mg/dL (from first bite of meal)    Lab Results  Component Value Date   GLUCAP 99 12/13/2022   HGBA1C 5.3 04/22/2022    Review of Glycemic Control  Latest Reference Range & Units 12/13/22 08:36  Glucose-Capillary 70 - 99 mg/dL 99   Diabetes history: Type 2 DM Outpatient Diabetes medications: Omnipod Dash with Dexcom- 2.5 units/hr= TDD 60 units, Metformin 100 mg BID Current orders for Inpatient glycemic control: insulin pump  Inpatient Diabetes Program Recommendations:    Spoke with patient and family regarding outpatient diabetes management. Patient is following by Newell Coral, RD for outpatient diabetes management. Verified rates in Omnipod and confirmed patient is type 2. Prior to pregnancy patient was using Monjaro and got her A1C down to goal. Patient plans to resume this once lactation is complete. Patient has been referred to endocrinology.  Reviewed patient's current A1c of  5.3%. Explained what a A1c is and what it measures. Also reviewed goal A1c with patient, importance of good glucose control @ home, and blood sugar goals. Reviewed patho of DM, role of pancreas, patho of insulin needs during labor, hormonal fluctuations following delivery, vascular changes, Dexcom readings, and other commorbidities.  Patient has a meter and testing supplies. Currently, wearing a Dexcom and has been calibrated and compared with inpatient fingersticks.   Patient has additional pump supplies if needed.   Current insulin pump settings are as follows:  Basal insulin  12A 2.5 units/hour Total daily basal insulin: 60 units/24 hours  Carb Coverage 1:2 1 unit for every 2 grams of carbohydrates  Insulin  Sensitivity 1:22 1 unit drops blood glucose 2 mg/dl  Target Glucose Goals 13Y 90-95 mg/dl  In preparation for delivery: Patient has temp basal program set in Omnipod for 0000-0000- 1.25 units/hr. Discussed with patient plan for starting temp basal setting at delivery or once glucose trends <80 mg/dL consistently with active labor.  Secure chat sent to Dr Para March; in agreement/orders received.   NURSING: Once insulin pump order set is ordered please print off the Patient insulin pump contract and flow sheet. The insulin pump contract should be signed by the patient and then placed in the chart. The patient insulin pump flow sheet will be completed by the patient at the bedside and the RN caring for the patient will use the patient's flow sheet to document in the [facility]. RN will need to complete the Nursing Insulin Pump Flowsheet at least once a shift. Patient will need to keep extra insulin pump supplies at the bedside at all times.    Thanks, Lujean Rave, MSN, RNC-OB Diabetes Coordinator [phone] (8a-5p)

## 2022-12-13 NOTE — Progress Notes (Signed)
Labor Progress Note  [name] is a 22 y.o. G2P0010 at [redacted]w[redacted]d presented for IOL T2DM, Polyhydramnios, FGR (resolved) with elevated dopplers   S: patient doing well, resting in bed comfortably no concerns   O:  BP 129/64   Pulse 89   Temp 98.1 F (36.7 C) (Oral)   Resp 16   Ht 5\' 5"  (1.651 m)   Wt 126.4 kg   LMP 04/05/2022 (Exact Date)   BMI 46.38 kg/m  EFM:130bpm/Moderate variability/ 15x15 accels/ None decels CAT: 1 Toco: regular, every 3 minutes   CVE: Dilation: Fingertip Effacement (%): 40 Cervical Position: Middle Station: Ballotable Presentation: Vertex Exam by:: Jaquilla Woodroof   A&P: 22 y.o. G2P0010 [redacted]w[redacted]d  here for IOL as above  #Labor: Requires more cervical ripening prior to FB placement.  Will given another Cyto 50mg  buccally.   #Pain: per patient request #FWB: CAT 1 #GBS negative  #Polyhydramnios # T2DM: Basal insulin 2.5u / hr, total 60u daily. Metformin dose BID last dose AM 12/10. Plan CBGs q4 hour while in latent labor, Ok to use dexcom if calibrated: Most recent u/s 12/09/2022 Posterior Placenta, AFI 35.58 HC 12% AC 30% EFW 2434 (23%). 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

## 2022-12-14 ENCOUNTER — Encounter (HOSPITAL_COMMUNITY): Payer: Self-pay | Admitting: Obstetrics & Gynecology

## 2022-12-14 ENCOUNTER — Encounter (HOSPITAL_COMMUNITY)
Admission: RE | Disposition: A | Discharge status: Home / Self Care | Payer: Self-pay | Source: Home / Self Care | Attending: Obstetrics & Gynecology

## 2022-12-14 DIAGNOSIS — Z3A36 36 weeks gestation of pregnancy: Secondary | ICD-10-CM

## 2022-12-14 DIAGNOSIS — O365930 Maternal care for other known or suspected poor fetal growth, third trimester, not applicable or unspecified: Secondary | ICD-10-CM

## 2022-12-14 DIAGNOSIS — O24424 Gestational diabetes mellitus in childbirth, insulin controlled: Secondary | ICD-10-CM

## 2022-12-14 DIAGNOSIS — O403XX0 Polyhydramnios, third trimester, not applicable or unspecified: Secondary | ICD-10-CM

## 2022-12-14 DIAGNOSIS — O99214 Obesity complicating childbirth: Secondary | ICD-10-CM

## 2022-12-14 LAB — GLUCOSE, CAPILLARY: Glucose-Capillary: 91 mg/dL (ref 70–99)

## 2022-12-14 SURGERY — Surgical Case
Anesthesia: Epidural

## 2022-12-14 MED ORDER — ACETAMINOPHEN 10 MG/ML IV SOLN
1000.0000 mg | Freq: Once | INTRAVENOUS | Status: DC | PRN
Start: 2022-12-14 — End: 2022-12-15

## 2022-12-14 MED ORDER — DEXTROSE 5 % IV SOLN
INTRAVENOUS | Status: DC | PRN
  Administered 2022-12-14: 3 g via INTRAVENOUS

## 2022-12-14 MED ORDER — DIPHENHYDRAMINE HCL 50 MG/ML IJ SOLN
INTRAMUSCULAR | Status: AC
  Filled 2022-12-14: qty 1

## 2022-12-14 MED ORDER — DEXAMETHASONE SODIUM PHOSPHATE 10 MG/ML IJ SOLN
INTRAMUSCULAR | Status: DC | PRN
  Administered 2022-12-14: 4 mg via INTRAVENOUS

## 2022-12-14 MED ORDER — FENTANYL CITRATE (PF) 100 MCG/2ML IJ SOLN: 25.0000 ug | INTRAMUSCULAR | Status: DC | PRN

## 2022-12-14 MED ORDER — STERILE WATER FOR IRRIGATION IR SOLN
Status: DC | PRN
  Administered 2022-12-14: 1000 mL

## 2022-12-14 MED ORDER — SODIUM CHLORIDE 0.9 % IV SOLN
INTRAVENOUS | Status: AC
  Filled 2022-12-14: qty 3

## 2022-12-14 MED ORDER — ONDANSETRON HCL 4 MG/2ML IJ SOLN
INTRAMUSCULAR | Status: DC | PRN
  Administered 2022-12-14: 4 mg via INTRAVENOUS

## 2022-12-14 MED ORDER — SODIUM CHLORIDE 0.9 % IR SOLN
Status: DC | PRN
  Administered 2022-12-14: 1

## 2022-12-14 MED ORDER — LIDOCAINE-EPINEPHRINE (PF) 2 %-1:200000 IJ SOLN
INTRAMUSCULAR | Status: DC | PRN
  Administered 2022-12-14: 3 mL via EPIDURAL
  Administered 2022-12-14: 5 mL via EPIDURAL
  Administered 2022-12-14: 2 mL via EPIDURAL
  Administered 2022-12-14: 5 mL via EPIDURAL
  Administered 2022-12-14: 2 mL via EPIDURAL

## 2022-12-14 MED ORDER — ASPIRIN-ACETAMINOPHEN-CAFFEINE 250-250-65 MG PO TABS
1.0000 | ORAL_TABLET | Freq: Once | ORAL | Status: AC
Start: 2022-12-14 — End: 2022-12-14
  Administered 2022-12-14: 1 via ORAL
  Filled 2022-12-14: qty 1

## 2022-12-14 MED ORDER — OXYTOCIN-SODIUM CHLORIDE 30-0.9 UT/500ML-% IV SOLN
INTRAVENOUS | Status: DC | PRN
  Administered 2022-12-14: 300 mL via INTRAVENOUS

## 2022-12-14 MED ORDER — FAMOTIDINE IN NACL 20-0.9 MG/50ML-% IV SOLN
20.0000 mg | Freq: Once | INTRAVENOUS | Status: AC
  Administered 2022-12-14: 20 mg via INTRAVENOUS
  Filled 2022-12-14: qty 50

## 2022-12-14 MED ORDER — SODIUM CHLORIDE 0.9 % IV SOLN
3.0000 g | INTRAVENOUS | Status: DC
  Filled 2022-12-14: qty 3

## 2022-12-14 MED ORDER — METOCLOPRAMIDE HCL 5 MG/ML IJ SOLN
10.0000 mg | Freq: Once | INTRAMUSCULAR | Status: AC
  Administered 2022-12-14: 10 mg via INTRAVENOUS
  Filled 2022-12-14: qty 2

## 2022-12-14 MED ORDER — TRANEXAMIC ACID-NACL 1000-0.7 MG/100ML-% IV SOLN
INTRAVENOUS | Status: DC | PRN
  Administered 2022-12-14: 1000 mg via INTRAVENOUS

## 2022-12-14 MED ORDER — ACETAMINOPHEN 10 MG/ML IV SOLN
INTRAVENOUS | Status: DC | PRN
  Administered 2022-12-14: 1000 mg via INTRAVENOUS

## 2022-12-14 MED ORDER — ACETAMINOPHEN 10 MG/ML IV SOLN
INTRAVENOUS | Status: AC
Start: 2022-12-14 — End: ?
  Filled 2022-12-14: qty 100

## 2022-12-14 MED ORDER — LACTATED RINGERS IV SOLN: INTRAVENOUS | Status: DC | PRN

## 2022-12-14 MED ORDER — OXYTOCIN-SODIUM CHLORIDE 30-0.9 UT/500ML-% IV SOLN
INTRAVENOUS | Status: AC
  Filled 2022-12-14: qty 500

## 2022-12-14 MED ORDER — FENTANYL CITRATE (PF) 100 MCG/2ML IJ SOLN
INTRAMUSCULAR | Status: AC
  Filled 2022-12-14: qty 2

## 2022-12-14 MED ORDER — MORPHINE SULFATE (PF) 0.5 MG/ML IJ SOLN
INTRAMUSCULAR | Status: AC
  Filled 2022-12-14: qty 10

## 2022-12-14 MED ORDER — SODIUM CHLORIDE 0.9 % IV SOLN
500.0000 mg | INTRAVENOUS | Status: DC
  Filled 2022-12-14: qty 5

## 2022-12-14 MED ORDER — FENTANYL CITRATE (PF) 100 MCG/2ML IJ SOLN
INTRAMUSCULAR | Status: DC | PRN
  Administered 2022-12-14: 100 ug via EPIDURAL

## 2022-12-14 MED ORDER — DEXMEDETOMIDINE HCL IN NACL 80 MCG/20ML IV SOLN
INTRAVENOUS | Status: DC | PRN
  Administered 2022-12-14: 8 ug via INTRAVENOUS
  Administered 2022-12-14: 4 ug via INTRAVENOUS

## 2022-12-14 MED ORDER — TRANEXAMIC ACID-NACL 1000-0.7 MG/100ML-% IV SOLN
INTRAVENOUS | Status: AC
  Filled 2022-12-14: qty 100

## 2022-12-14 MED ORDER — SOD CITRATE-CITRIC ACID 500-334 MG/5ML PO SOLN
30.0000 mL | ORAL | Status: AC
Start: 2022-12-14 — End: 2022-12-14
  Administered 2022-12-14: 30 mL via ORAL

## 2022-12-14 MED ORDER — SODIUM CHLORIDE 0.9 % IV SOLN
INTRAVENOUS | Status: DC | PRN
  Administered 2022-12-14: 500 mg via INTRAVENOUS

## 2022-12-14 MED ORDER — SODIUM CHLORIDE 0.9 % AMNIOINFUSION
INTRAVENOUS | Status: DC
  Administered 2022-12-14: 125 mL via INTRAUTERINE

## 2022-12-14 MED ORDER — MORPHINE SULFATE (PF) 0.5 MG/ML IJ SOLN
INTRAMUSCULAR | Status: DC | PRN
  Administered 2022-12-14: 3 mg via EPIDURAL

## 2022-12-14 SURGICAL SUPPLY — 31 items
CANISTER WOUND CARE 500ML ATS (WOUND CARE) IMPLANT
CHLORAPREP W/TINT 26 (MISCELLANEOUS) ×2 IMPLANT
CLAMP UMBILICAL CORD (MISCELLANEOUS) ×1 IMPLANT
CLOTH BEACON ORANGE TIMEOUT ST (SAFETY) ×1 IMPLANT
DRESSING PREVENA PLUS CUSTOM (GAUZE/BANDAGES/DRESSINGS) IMPLANT
DRSG OPSITE POSTOP 4X10 (GAUZE/BANDAGES/DRESSINGS) ×1 IMPLANT
DRSG PREVENA PLUS CUSTOM (GAUZE/BANDAGES/DRESSINGS) ×1
ELECT REM PT RETURN 9FT ADLT (ELECTROSURGICAL) ×1
ELECTRODE REM PT RTRN 9FT ADLT (ELECTROSURGICAL) ×1 IMPLANT
EXTRACTOR VACUUM M CUP 4 TUBE (SUCTIONS) IMPLANT
GLOVE BIOGEL PI IND STRL 7.0 (GLOVE) ×3 IMPLANT
GLOVE ECLIPSE 7.0 STRL STRAW (GLOVE) ×1 IMPLANT
GOWN STRL REUS W/TWL LRG LVL3 (GOWN DISPOSABLE) ×1 IMPLANT
GOWN STRL REUS W/TWL XL LVL3 (GOWN DISPOSABLE) ×1 IMPLANT
KIT ABG SYR 3ML LUER SLIP (SYRINGE) IMPLANT
NDL HYPO 25X5/8 SAFETYGLIDE (NEEDLE) ×1 IMPLANT
NEEDLE HYPO 22GX1.5 SAFETY (NEEDLE) ×1 IMPLANT
NEEDLE HYPO 25X5/8 SAFETYGLIDE (NEEDLE) ×1 IMPLANT
NS IRRIG 1000ML POUR BTL (IV SOLUTION) ×1 IMPLANT
PACK C SECTION WH (CUSTOM PROCEDURE TRAY) ×1 IMPLANT
PAD ABD 7.5X8 STRL (GAUZE/BANDAGES/DRESSINGS) ×1 IMPLANT
PAD OB MATERNITY 4.3X12.25 (PERSONAL CARE ITEMS) ×1 IMPLANT
RTRCTR C-SECT PINK 25CM LRG (MISCELLANEOUS) IMPLANT
SUT PDS AB 0 CTX 36 PDP370T (SUTURE) IMPLANT
SUT PLAIN 2 0 XLH (SUTURE) IMPLANT
SUT VIC AB 0 CTX36XBRD ANBCTRL (SUTURE) ×2 IMPLANT
SUT VIC AB 4-0 KS 27 (SUTURE) ×1 IMPLANT
SYR CONTROL 10ML LL (SYRINGE) ×1 IMPLANT
TOWEL OR 17X24 6PK STRL BLUE (TOWEL DISPOSABLE) ×1 IMPLANT
TRAY FOLEY W/BAG SLVR 14FR LF (SET/KITS/TRAYS/PACK) ×1 IMPLANT
WATER STERILE IRR 1000ML POUR (IV SOLUTION) ×1 IMPLANT

## 2022-12-14 NOTE — Progress Notes (Signed)
Labor Progress Note [name] is a 22 y.o. G2P0010 at [redacted]w[redacted]d presented for  IOL 2/2 T2DM on insulin, poly (AFI 35 last week), elevated dopplers   S: Feeling comfortable. Support persons at bedside. Feeling intermittent pressure.   O:  BP 139/79   Pulse 91   Temp 98.4 F (36.9 C) (Oral)   Resp 16   Ht 5\' 5"  (1.651 m)   Wt 126.4 kg   LMP 04/05/2022 (Exact Date)   SpO2 99%   BMI 46.38 kg/m  EFM: 145/Moderate variability/+ Accels/ No decels Toco: regular, q3-5 min  CVE: Dilation: 6 Effacement (%): 70 Cervical Position: Middle Station: -2 Presentation: Vertex Exam by:: Dr. Sherron Ales   A&P: 22 y.o. G2P0010 [redacted]w[redacted]d  IOL 2/2 T2DM on insulin, poly (AFI 35 last week), elevated dopplers   #Labor: Restart pitocin as patient still about 6cm. Fetal head well applied. FSE and IUPC in place. #Pain: Epidural #FWB: Cat 1 #GBS negative    #Polyhydramnios #T2DM: Home regimen: Basal insulin 2.5u / hr, total 60u daily. 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

## 2022-12-14 NOTE — Op Note (Addendum)
[name] PROCEDURE DATE: 12/15/2022  PREOPERATIVE DIAGNOSES: Intrauterine pregnancy at [redacted]w[redacted]d weeks gestation; failure to progress: arrest of dilation in latent phase of labor; failed induction of labor for preexisting Type 2 diabetes mellitus complicated by polyhydramnios; morbid obesity   POSTOPERATIVE DIAGNOSES: The same  PROCEDURE: Low Transverse Cesarean Section  SURGEON:  Dr. Jaynie Collins  ASSISTANT:  Dr. Wylene Simmer. An experienced assistant was required given the standard of surgical care given the complexity of the case.  This assistant was needed for exposure, dissection, suctioning, retraction, instrument exchange, assisting with delivery with administration of fundal pressure, and for overall help during the procedure.  ANESTHESIOLOGY TEAM: Anesthesiologist: Kaylyn Layer, MD; Collene Schlichter, MD; Elmer Picker, MD CRNA: Claudina Lick, CRNA  INDICATIONS: [name] is a 22 y.o. [phone] at [redacted]w[redacted]d here for cesarean section secondary to the indications listed under preoperative diagnoses; please see preoperative note for further details.  The risks of surgery were discussed with the patient including but were not limited to: bleeding which may require transfusion or reoperation; infection which may require antibiotics; injury to bowel, bladder, ureters or other surrounding organs; injury to the fetus; need for additional procedures including hysterectomy in the event of a life-threatening hemorrhage; formation of adhesions; placental abnormalities wth subsequent pregnancies; incisional problems; thromboembolic phenomenon and other postoperative/anesthesia complications.  The patient concurred with the proposed plan, giving informed written consent for the procedure.    FINDINGS:  Viable female infant in cephalic presentation.  Apgars pending at time of note but infant is stable in OR and with mother.  Clear amniotic fluid.  Intact placenta, three vessel cord.   Normal uterus, fallopian tubes and ovaries bilaterally. Prevena was placed over closed skin incision at the end of the procedure.  ANESTHESIA: Epidural  ESTIMATED BLOOD LOSS: 415 ml SPECIMENS: Placenta sent to L&D COMPLICATIONS: None immediate  PROCEDURE IN DETAIL:  The patient preoperatively received intravenous antibiotics and had sequential compression devices applied to her lower extremities.  She was then taken to the operating room where the epidural anesthesia was dosed up to surgical level and was found to be adequate. She was then placed in a dorsal supine position with a leftward tilt, and prepped and draped in a sterile manner.  A foley catheter was placed into her bladder and attached to constant gravity.  After an adequate timeout was performed, a Pfannenstiel skin incision was made with scalpel and carried through to the underlying layer of fascia. The fascia was incised in the midline, and this incision was extended bilaterally in a blunt fashion.  The underlying rectus muscles were dissected off the fascia superiorly and inferiorly in a blunt fashion. The rectus muscles were separated in the midline and the peritoneum was entered bluntly.  The Alexis self-retaining retractor was introduced into the abdominal cavity.  Attention was turned to the lower uterine segment where a low transverse hysterotomy was made with a scalpel and extended bilaterally bluntly.  The infant was successfully delivered, the cord was clamped and cut after one minute, and the infant was handed over to the awaiting neonatology team. Uterine massage was then administered, and the placenta delivered intact with a three-vessel cord. The uterus was then cleared of clots and debris.  The hysterotomy was closed with 0 Vicryl in a running locked fashion, and an imbricating layer was also placed with 0 Vicryl.  Figure-of-eight 0 Vicryl serosal stitches were placed to help with hemostasis.  The pelvis was cleared of all  clot and  debris. Hemostasis was confirmed on all surfaces.  The retractor was removed.  The peritoneum was closed with a 0 Vicryl running stitch. The fascia was then closed using 0 PDS in a running fashion.  The subcutaneous layer was irrigated, reapproximated with 2-0 plain gut interrupted stitches, and the skin was closed with a 4-0 Vicryl subcuticular stitch. After the skin was closed, a Prevena disposable negative pressure wound therapy device was placed over the incision.  The suction was activated at a pressure of -125 mmHg.  The adhesive was affixed well and there were no leaks noted.  The patient tolerated the procedure well. Sponge, instrument and needle counts were correct x 3.  She was taken to the recovery room in stable condition.    Jaynie Collins, MD, FACOG Obstetrician & Gynecologist, [facility] for [employer], [facility]

## 2022-12-14 NOTE — Progress Notes (Signed)
Labor Progress Note [name] is a 22 y.o. G2P0010 at [redacted]w[redacted]d presented for  IOL 2/2 T2DM on insulin, poly (AFI 35 last week), elevated dopplers   S: Comfortable with epidural   O:  BP (!) 132/59   Pulse 80   Temp 98.2 F (36.8 C) (Oral)   Resp 18   Ht 5\' 5"  (1.651 m)   Wt 126.4 kg   LMP 04/05/2022 (Exact Date)   SpO2 99%   BMI 46.38 kg/m  EFM: 140 /Moderate variability/+ Accels/ periods of regular variable decelerations  Toco: regular, q3-5 min  CVE: Dilation: 5.5 Effacement (%): 70, 80 Cervical Position: Middle Station: -2, -1 Presentation: Vertex Exam by:: Haylynn Pha MD   A&P: 22 y.o. G2P0010 [redacted]w[redacted]d  IOL 2/2 T2DM on insulin, poly (AFI 35 last week), elevated dopplers   #Labor: Pitocin running, Contractions every 3-4 min. Discussed titration as long as variability continues to be reassuring  #Pain: Epidural #FWB: Cat 2, Amnioinfusion now running, continue position changes., will limit pitocin pend fetal tolerance  #GBS negative    #Polyhydramnios #T2DM: Home regimen: Basal insulin 2.5u / hr, total 60u daily. 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

## 2022-12-14 NOTE — Progress Notes (Signed)
LABOR PROGRESS NOTE  Patient Name: [name], female   DOB: 07/26/2000, 22 y.o.  MRN: [id number]  G2P0010 at [redacted]w[redacted]d admitted for IOL 2/2 T2DM on insulin, poly (AFI 35 last week), elevated dopplers  S: Feeling comfortable. Has been sitting up in throne for ~1 hour  O:  BP 132/74   Pulse (!) 113   Temp 98.3 F (36.8 C) (Oral)   Resp 16   Ht 5\' 5"  (1.651 m)   Wt 126.4 kg   LMP 04/05/2022 (Exact Date)   SpO2 98%   BMI 46.38 kg/m  EFM:145 bpm/Moderate variability/ 15x15 accels/ Variable decels CAT: 2 Toco: regular, every 2 minutes   CVE: Dilation: 6 Effacement (%): 60 Cervical Position: Middle Station: -3 Presentation: Vertex Exam by:: Earlene Plater, MD   A&P:   Extensive discussions had with patient and family regarding risks of AROM especially in setting of poly, including cord prolapse. Also discussed importance of progressing labor, as she has been on pitocin for ~5 hours with contractions every 2 minutes without cervical change. Patient expressed verbal consent to AROM. Babe found to be well applied to cervix with fundal pressure from RN. FSE then used to slowly trickle fluid. Allowed fluid to drain for several minutes before placing patient in upright position and removing my fingers. Copious clear fluid. Mom tolerated well. Babe with some variables after rupture with moderate variability and accels.  Joanne Gavel, MD FMOB Fellow, Faculty practice [facility], Center for [facility] 12/14/22  4:38 AM

## 2022-12-14 NOTE — Progress Notes (Addendum)
LABOR PROGRESS NOTE  Patient Name: [name], female   DOB: 05-16-2000, 22 y.o.  MRN: [id number]  Patient's cervix unchanged with 6 hours of pitocin with adequate contractions via IUPC. Discussed with her as she has been ruptured for ~18 hours in addition to not changing in dilation as above, she certainly qualifies for an arrest of latent phase of labor. At this time, we would recommend a pLTCS for delivery. Patient is amenable to this.   The risks of cesarean section discussed with the patient included but were not limited to: bleeding which may require transfusion or reoperation; infection which may require antibiotics; injury to bowel, bladder, ureters or other surrounding organs; injury to the fetus; need for additional procedures including hysterectomy in the event of a life-threatening hemorrhage; placental abnormalities with subsequent pregnancies, incisional problems, thromboembolic phenomenon and other postoperative/anesthesia complications. The patient concurred with the proposed plan, giving informed written consent for the procedure. Patient NPO status waived given urgency of case. Anesthesia and OR aware. Preoperative prophylactic antibiotics and SCDs ordered on call to the OR.  Joanne Gavel, MD FMOB Fellow, Faculty practice [facility] 12/14/22  10:24 PM

## 2022-12-14 NOTE — Progress Notes (Signed)
Labor Progress Note  [name] is a 22 y.o. G2P0010 at [redacted]w[redacted]d presented for IOL 2/2 T2DM, Polyhydramnios, FGR (resolved) with elevated dopplers   S: Feeling much more comfortable, epidural working better. Family at bedside. She is agreeable to try AROM again.   O:  BP 126/72   Pulse (!) 123   Temp 98.3 F (36.8 C) (Oral)   Resp 15   Ht 5\' 5"  (1.651 m)   Wt 126.4 kg   LMP 04/05/2022 (Exact Date)   SpO2 98%   BMI 46.38 kg/m  EFM:130 bpm/Moderate variability/ 15x15 accels/ None decels CAT: 1 Toco: regular, every 3 minutes   CVE: Dilation: 5 Effacement (%): 40 Cervical Position: Middle Station: Ballotable Presentation: Vertex Exam by:: [name], MD   A&P: 22 y.o. G2P0010 [redacted]w[redacted]d  here for IOL as above  #Labor: Progressing, Baby continues to be ballotable but noting fetal descent. Continue pitocin titration, will try throne positioning and attempt AROM again; may consider AROM with FSE for slow trickle.  #Pain: Epidural in place  #FWB: Cat 1 #GBS negative   #Polyhydramnios # T2DM: Home regimen: Basal insulin 2.5u / hr, total 60u daily. 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

## 2022-12-14 NOTE — Progress Notes (Addendum)
LABOR PROGRESS NOTE  Patient Name: [name], female   DOB: [birth year], 22 y.o.  MRN: [id number]  Called by RN for difficulty monitoring after AROM. IUPC and FSE placed after verbal consent obtained from patient. Cat II tracing with some shallow variables with contractions.  Joanne Gavel, MD FMOB Fellow, Faculty practice [facility], [facility] 12/14/22  5:20 AM

## 2022-12-15 ENCOUNTER — Encounter (HOSPITAL_COMMUNITY): Payer: Self-pay | Admitting: Obstetrics & Gynecology

## 2022-12-15 ENCOUNTER — Other Ambulatory Visit: Payer: Self-pay

## 2022-12-15 LAB — CBC
HCT: 30.6 % — ABNORMAL LOW (ref 36.0–46.0)
HCT: 31 % — ABNORMAL LOW (ref 36.0–46.0)
Hemoglobin: 10.1 g/dL — ABNORMAL LOW (ref 12.0–15.0)
Hemoglobin: 10.3 g/dL — ABNORMAL LOW (ref 12.0–15.0)
MCH: 30.5 pg (ref 26.0–34.0)
MCH: 30.4 pg (ref 26.0–34.0)
MCHC: 33 g/dL (ref 30.0–36.0)
MCHC: 33.2 g/dL (ref 30.0–36.0)
MCV: 92.4 fL (ref 80.0–100.0)
MCV: 91.4 fL (ref 80.0–100.0)
Platelets: 238 10*3/uL (ref 150–400)
Platelets: 236 10*3/uL (ref 150–400)
RBC: 3.31 MIL/uL — ABNORMAL LOW (ref 3.87–5.11)
RBC: 3.39 MIL/uL — ABNORMAL LOW (ref 3.87–5.11)
RDW: 15.7 % — ABNORMAL HIGH (ref 11.5–15.5)
RDW: 15.9 % — ABNORMAL HIGH (ref 11.5–15.5)
WBC: 18 10*3/uL — ABNORMAL HIGH (ref 4.0–10.5)
WBC: 15.1 10*3/uL — ABNORMAL HIGH (ref 4.0–10.5)
nRBC: 0 % (ref 0.0–0.2)
nRBC: 0 % (ref 0.0–0.2)

## 2022-12-15 LAB — GLUCOSE, CAPILLARY
Glucose-Capillary: 76 mg/dL (ref 70–99)
Glucose-Capillary: 82 mg/dL (ref 70–99)

## 2022-12-15 LAB — COMPREHENSIVE METABOLIC PANEL
ALT: 20 U/L (ref 0–44)
AST: 18 U/L (ref 15–41)
Albumin: 2.1 g/dL — ABNORMAL LOW (ref 3.5–5.0)
Alkaline Phosphatase: 72 U/L (ref 38–126)
Anion gap: 6 (ref 5–15)
BUN: <5 mg/dL — ABNORMAL LOW (ref 6–20)
CO2: 23 mmol/L (ref 22–32)
Calcium: 8.6 mg/dL — ABNORMAL LOW (ref 8.9–10.3)
Chloride: 107 mmol/L (ref 98–111)
Creatinine, Ser: 0.63 mg/dL (ref 0.44–1.00)
GFR, Estimated: >60 mL/min (ref >60)
Glucose, Bld: 97 mg/dL (ref 70–99)
Potassium: 3.7 mmol/L (ref 3.5–5.1)
Sodium: 136 mmol/L (ref 135–145)
Total Bilirubin: 0.7 mg/dL (ref <1.2)
Total Protein: 5.6 g/dL — ABNORMAL LOW (ref 6.5–8.1)

## 2022-12-15 MED ORDER — ENOXAPARIN SODIUM 60 MG/0.6ML IJ SOSY
60.0000 mg | PREFILLED_SYRINGE | INTRAMUSCULAR | Status: DC
  Administered 2022-12-15 – 2022-12-16 (×2): 60 mg via SUBCUTANEOUS
  Filled 2022-12-15 (×2): qty 0.6

## 2022-12-15 MED ORDER — METOPROLOL SUCCINATE ER 50 MG PO TB24
50.0000 mg | ORAL_TABLET | Freq: Every day | ORAL | Status: DC
  Administered 2022-12-15 – 2022-12-16 (×2): 50 mg via ORAL
  Filled 2022-12-15 (×3): qty 1

## 2022-12-15 MED ORDER — KETOROLAC TROMETHAMINE 30 MG/ML IJ SOLN: 30.0000 mg | Freq: Four times a day (QID) | INTRAMUSCULAR | Status: AC | PRN

## 2022-12-15 MED ORDER — ONDANSETRON HCL 4 MG/2ML IJ SOLN
INTRAMUSCULAR | Status: AC
Start: 2022-12-15 — End: ?
  Filled 2022-12-15: qty 2

## 2022-12-15 MED ORDER — SODIUM CHLORIDE 0.9% FLUSH
3.0000 mL | INTRAVENOUS | Status: DC | PRN
Start: 2022-12-15 — End: 2022-12-17

## 2022-12-15 MED ORDER — PHENYLEPHRINE 80 MCG/ML (10ML) SYRINGE FOR IV PUSH (FOR BLOOD PRESSURE SUPPORT)
PREFILLED_SYRINGE | INTRAVENOUS | Status: AC
  Filled 2022-12-15: qty 10

## 2022-12-15 MED ORDER — SENNOSIDES-DOCUSATE SODIUM 8.6-50 MG PO TABS
2.0000 | ORAL_TABLET | Freq: Every day | ORAL | Status: DC
  Administered 2022-12-15 – 2022-12-17 (×2): 2 via ORAL
  Filled 2022-12-15 (×3): qty 2

## 2022-12-15 MED ORDER — TETANUS-DIPHTH-ACELL PERTUSSIS 5-2.5-18.5 LF-MCG/0.5 IM SUSY: 0.5000 mL | PREFILLED_SYRINGE | Freq: Once | INTRAMUSCULAR | Status: DC

## 2022-12-15 MED ORDER — GABAPENTIN 300 MG PO CAPS
300.0000 mg | ORAL_CAPSULE | Freq: Two times a day (BID) | ORAL | Status: DC
  Administered 2022-12-15 – 2022-12-17 (×5): 300 mg via ORAL
  Filled 2022-12-15 (×5): qty 1

## 2022-12-15 MED ORDER — SIMETHICONE 80 MG PO CHEW
80.0000 mg | CHEWABLE_TABLET | ORAL | Status: DC | PRN
  Administered 2022-12-15: 80 mg via ORAL
  Filled 2022-12-15: qty 1

## 2022-12-15 MED ORDER — DIBUCAINE (PERIANAL) 1 % EX OINT: 1.0000 | TOPICAL_OINTMENT | CUTANEOUS | Status: DC | PRN

## 2022-12-15 MED ORDER — NEOSTIGMINE METHYLSULFATE 3 MG/3ML IV SOSY
PREFILLED_SYRINGE | INTRAVENOUS | Status: AC
  Filled 2022-12-15: qty 3

## 2022-12-15 MED ORDER — DIPHENHYDRAMINE HCL 25 MG PO CAPS: 25.0000 mg | ORAL_CAPSULE | ORAL | Status: DC | PRN

## 2022-12-15 MED ORDER — SCOPOLAMINE 1 MG/3DAYS TD PT72
1.0000 | MEDICATED_PATCH | Freq: Once | TRANSDERMAL | Status: DC
  Administered 2022-12-15: 1.5 mg via TRANSDERMAL
  Filled 2022-12-15: qty 1

## 2022-12-15 MED ORDER — FUROSEMIDE 20 MG PO TABS
20.0000 mg | ORAL_TABLET | Freq: Two times a day (BID) | ORAL | Status: DC
  Administered 2022-12-15 – 2022-12-17 (×5): 20 mg via ORAL
  Filled 2022-12-15 (×5): qty 1

## 2022-12-15 MED ORDER — OXYTOCIN-SODIUM CHLORIDE 30-0.9 UT/500ML-% IV SOLN
INTRAVENOUS | Status: AC
  Filled 2022-12-15: qty 500

## 2022-12-15 MED ORDER — OXYCODONE HCL 5 MG PO TABS
5.0000 mg | ORAL_TABLET | ORAL | Status: DC | PRN
  Administered 2022-12-16: 10 mg via ORAL
  Administered 2022-12-16: 5 mg via ORAL
  Filled 2022-12-15: qty 2
  Filled 2022-12-15: qty 1

## 2022-12-15 MED ORDER — PRENATAL MULTIVITAMIN CH
1.0000 | ORAL_TABLET | Freq: Every day | ORAL | Status: DC
  Administered 2022-12-15: 1 via ORAL
  Filled 2022-12-15: qty 1

## 2022-12-15 MED ORDER — PROPOFOL 10 MG/ML IV BOLUS
INTRAVENOUS | Status: AC
  Filled 2022-12-15: qty 20

## 2022-12-15 MED ORDER — KETOROLAC TROMETHAMINE 30 MG/ML IJ SOLN
30.0000 mg | Freq: Four times a day (QID) | INTRAMUSCULAR | Status: AC
  Administered 2022-12-15 – 2022-12-16 (×4): 30 mg via INTRAVENOUS
  Filled 2022-12-15 (×4): qty 1

## 2022-12-15 MED ORDER — MAGNESIUM HYDROXIDE 400 MG/5ML PO SUSP
30.0000 mL | ORAL | Status: DC | PRN
Start: 2022-12-15 — End: 2022-12-17

## 2022-12-15 MED ORDER — ACETAMINOPHEN 500 MG PO TABS
1000.0000 mg | ORAL_TABLET | Freq: Four times a day (QID) | ORAL | Status: DC
  Administered 2022-12-15 – 2022-12-17 (×8): 1000 mg via ORAL
  Filled 2022-12-15 (×10): qty 2

## 2022-12-15 MED ORDER — ARTIFICIAL TEARS OPHTHALMIC OINT
TOPICAL_OINTMENT | OPHTHALMIC | Status: AC
Start: 2022-12-15 — End: ?
  Filled 2022-12-15: qty 3.5

## 2022-12-15 MED ORDER — DIPHENHYDRAMINE HCL 50 MG/ML IJ SOLN
INTRAMUSCULAR | Status: DC | PRN
  Administered 2022-12-15: 12.5 mg via INTRAVENOUS

## 2022-12-15 MED ORDER — GLYCOPYRROLATE PF 0.2 MG/ML IJ SOSY
PREFILLED_SYRINGE | INTRAMUSCULAR | Status: AC
Start: 2022-12-15 — End: ?
  Filled 2022-12-15: qty 1

## 2022-12-15 MED ORDER — OXYTOCIN-SODIUM CHLORIDE 30-0.9 UT/500ML-% IV SOLN: 2.5000 [IU]/h | INTRAVENOUS | Status: AC

## 2022-12-15 MED ORDER — FERROUS SULFATE 325 (65 FE) MG PO TABS
325.0000 mg | ORAL_TABLET | ORAL | Status: DC
  Administered 2022-12-15 – 2022-12-17 (×2): 325 mg via ORAL
  Filled 2022-12-15 (×2): qty 1

## 2022-12-15 MED ORDER — ACETAMINOPHEN 500 MG PO TABS
1000.0000 mg | ORAL_TABLET | Freq: Four times a day (QID) | ORAL | Status: DC
  Administered 2022-12-15: 1000 mg via ORAL

## 2022-12-15 MED ORDER — NALOXONE HCL 0.4 MG/ML IJ SOLN: 0.4000 mg | INTRAMUSCULAR | Status: DC | PRN

## 2022-12-15 MED ORDER — EPHEDRINE 5 MG/ML INJ
INTRAVENOUS | Status: AC
  Filled 2022-12-15: qty 5

## 2022-12-15 MED ORDER — DIPHENHYDRAMINE HCL 25 MG PO CAPS: 25.0000 mg | ORAL_CAPSULE | Freq: Four times a day (QID) | ORAL | Status: DC | PRN

## 2022-12-15 MED ORDER — IBUPROFEN 600 MG PO TABS
600.0000 mg | ORAL_TABLET | Freq: Four times a day (QID) | ORAL | Status: DC
  Administered 2022-12-16 – 2022-12-17 (×5): 600 mg via ORAL
  Filled 2022-12-15 (×5): qty 1

## 2022-12-15 MED ORDER — ONDANSETRON HCL 4 MG/2ML IJ SOLN: 4.0000 mg | Freq: Three times a day (TID) | INTRAMUSCULAR | Status: DC | PRN

## 2022-12-15 MED ORDER — MEASLES, MUMPS & RUBELLA VAC IJ SOLR: 0.5000 mL | Freq: Once | INTRAMUSCULAR | Status: DC

## 2022-12-15 MED ORDER — DIPHENHYDRAMINE HCL 50 MG/ML IJ SOLN: 12.5000 mg | INTRAMUSCULAR | Status: DC | PRN

## 2022-12-15 MED ORDER — HYDROMORPHONE HCL 2 MG PO TABS: 2.0000 mg | ORAL_TABLET | ORAL | Status: DC | PRN

## 2022-12-15 MED ORDER — WITCH HAZEL-GLYCERIN EX PADS: 1.0000 | MEDICATED_PAD | CUTANEOUS | Status: DC | PRN

## 2022-12-15 MED ORDER — ZOLPIDEM TARTRATE 5 MG PO TABS: 5.0000 mg | ORAL_TABLET | Freq: Every evening | ORAL | Status: DC | PRN

## 2022-12-15 MED ORDER — MENTHOL 3 MG MT LOZG
1.0000 | LOZENGE | OROMUCOSAL | Status: DC | PRN
Start: 2022-12-15 — End: 2022-12-16

## 2022-12-15 MED ORDER — COCONUT OIL OIL
1.0000 | TOPICAL_OIL | Status: DC | PRN
  Administered 2022-12-15: 1 via TOPICAL

## 2022-12-15 MED ORDER — SUCCINYLCHOLINE CHLORIDE 200 MG/10ML IV SOSY
PREFILLED_SYRINGE | INTRAVENOUS | Status: AC
Start: 2022-12-15 — End: ?
  Filled 2022-12-15: qty 10

## 2022-12-15 MED ORDER — LIDOCAINE 2% (20 MG/ML) 5 ML SYRINGE
INTRAMUSCULAR | Status: AC
Start: 2022-12-15 — End: ?
  Filled 2022-12-15: qty 5

## 2022-12-15 MED ORDER — ROCURONIUM BROMIDE 10 MG/ML (PF) SYRINGE
PREFILLED_SYRINGE | INTRAVENOUS | Status: AC
  Filled 2022-12-15: qty 10

## 2022-12-15 MED ORDER — NALOXONE HCL 4 MG/10ML IJ SOLN: 1.0000 ug/kg/h | INTRAVENOUS | Status: DC | PRN

## 2022-12-15 NOTE — Progress Notes (Signed)
POSTPARTUM PROGRESS NOTE  POD #1  Subjective:  [name] is a 22 y.o. [id number] s/p primary LTCS at [redacted]w[redacted]d. No acute events overnight. She reports she is doing well. She denies any problems with ambulating, voiding or po intake. Denies nausea or vomiting. She has not passed flatus. Pain is well controlled.  Lochia is less than a period.  Objective: Blood pressure 119/62, pulse 84, temperature 97.8 F (36.6 C), temperature source Oral, resp. rate 18, height 5\' 5"  (1.651 m), weight 126.4 kg, last menstrual period 04/05/2022, SpO2 99%, unknown if currently breastfeeding.  Physical Exam:  General: alert, cooperative and no distress Chest: no respiratory distress Heart: regular rate, distal pulses intact Uterine Fundus: firm, appropriately tender DVT Evaluation: No calf swelling or tenderness Extremities: Trace bilateral lower extremity edema Skin: warm, dry; incision clean/dry/intact w/ Prevena dressing in place  Recent Labs    12/15/22 0156 12/15/22 0606  HGB 10.1* 10.3*  HCT 30.6* 31.0*    Assessment/Plan: [name] is a 22 y.o. [id number] s/p pLTCS at [redacted]w[redacted]d for failure to progress.  POD#1 - Doing welll; pain well controlled. H/H appropriate  Routine postpartum care  OOB, ambulated  Lovenox for VTE prophylaxis Acute Postoperative Blood Loss Anemia: asymptomatic, not clinically significant  Start po ferrous sulfate every other day  Type 2 DM: Insulin pump adjusted today per DM education  Cont Metformin  Previously on Mounjaro -- discussed w pt safety profile unknown so would prefer she cont off esp because she plans to breastfeed  Contraception: POPs Feeding: breastfeeding  Dispo: Plan for discharge tomorrow vs 12/13 pending progress.   LOS: 2 days   Sundra Aland, MD OB Fellow  12/15/2022, 8:26 PM

## 2022-12-15 NOTE — Transfer of Care (Signed)
Immediate Anesthesia Transfer of Care Note  Patient: [name]  Procedure(s) Performed: CESAREAN SECTION  Patient Location: PACU  Anesthesia Type:Epidural  Level of Consciousness: awake, alert , and oriented  Airway & Oxygen Therapy: Patient Spontanous Breathing  Post-op Assessment: Report given to RN and Post -op Vital signs reviewed and stable  Post vital signs: Reviewed and stable  Last Vitals:  Vitals Value Taken Time  BP 131/68 12/15/22 0030  Temp 36.5 C 12/15/22 0030  Pulse 115 12/15/22 0036  Resp 16 12/15/22 0036  SpO2 96 % 12/15/22 0036  Vitals shown include unfiled device data.  Last Pain:  Vitals:   12/15/22 0030  TempSrc: Oral  PainSc: 0-No pain         Complications: No notable events documented.

## 2022-12-15 NOTE — Inpatient Diabetes Management (Addendum)
Inpatient Diabetes Program Recommendations  AACE/ADA: New Consensus Statement on Inpatient Glycemic Control (2015)  Target Ranges:  Prepandial:   less than 140 mg/dL      Peak postprandial:   less than 180 mg/dL (1-2 hours)      Critically ill patients:  140 - 180 mg/dL    Latest Reference Range & Units 12/15/22 07:50  Glucose-Capillary 70 - 99 mg/dL 76      History: Type 2 Diabetes  Home DM Meds: Omnipod Dash with Dexcom- 2.5 units/hr= TDD 60 units, Metformin 100 mg BID   Current Orders: Insulin Pump Q4 hours    Met w/ pt at bedside this afternoon.  Pt told me she reduced her basal rate on her insulin pump to 1.25 units/hr, changed her Carb Ratio to 1 unit for every 6 grams of Carbohydrates (was 1:2 grams Carbs), and adjusted her Correction/Sensitivity Factor to 1:15 (was 1:22).  We talked about how lowering her Correction factor to 1:15 will give her more insulin and this was not what she wanted to do.  Pt then adjusted her Correction factor to 1:30 which will give her less insulin when bolusing for elevated CBG.  We talked about her current basal rate at 1.25 units/hr.  Discussed with pt that she may need less basal insulin given her CBG this AM was 76.  Told pt I will reach out to MD and see if they are OK with pt reducing the basal rate to 1 unit/hr for now.  Dexcom reading was 78 at 2:30pm while I was in the room.  All of the above reviewed with RN Victorino Dike.  Awaiting call back from Dr. Marisue Humble   Dr. Marisue Humble called me back at 3:10pm.  All of the above reviewed and permission given to me to have pt change her Correction Factor to 1:30 and to have pt reduce her basal rate to 1 unit/hr.  Sent [place]ok to [employer] to let pt know to reduce the basal rate to 1 unit/hr.     --Will follow patient during hospitalization--  Ambrose Finland RN, MSN, CDCES Diabetes Coordinator Inpatient Glycemic Control Team Team Pager: [phone] (8a-5p)

## 2022-12-15 NOTE — Progress Notes (Signed)
Pt changed basal rate insulin on her pump from 1.25unit to 1 unit per DM educator and Dr. Jarrett Ables.

## 2022-12-15 NOTE — Lactation Note (Signed)
This note was copied from a baby's chart.  NICU Lactation Consultation Note  Patient Name: [name] UEAVW'U Date: 12/15/2022 Age:24 hours  Reason for consult: Initial assessment; NICU baby; Late-preterm 34-36.6wks; Maternal endocrine disorder; Infant < 6lbs; Primapara; 1st time breastfeeding Type of Endocrine Disorder?: Diabetes; PCOS (DM2 (insulin pump and metformin))  SUBJECTIVE Visited with family of 82 hours old LPI NICU female; Baby "[name]" got admitted to the NICU due to respiratory distress and hypoglycemia. [name] is a P1 and reported she started pumping this morning and already getting enough volume to collect, praised her for her efforts. Her plan is to do both, direct breastfeeding along with pumping and bottle feeding but since she's planning on returning to work she's planning on switching to exclusively pumping and bottle feeding eventually. Fit her with a pumping band size XL Green for hands on pumping and requested coconut oil to her [employer] T. Reviewed pumping schedule, pumping log, lactogenesis II/III and anticipatory guidelines.   OBJECTIVE Infant data: Mother's Current Feeding Choice: Breast Milk and Donor Milk  O2 Device: CPAP O2 Flow Rate (L/min): 4 L/min FiO2 (%): 21 %  Infant feeding assessment Scale for Readiness: 5   Maternal data: [name] C-Section, Low Transverse Has patient been taught Hand Expression?: Yes Hand Expression Comments: colostrum noted Significant Breast History:: (+) breast changes during the pregnancy, started leaking at 19 weeks Current breast feeding challenges:: NICU admission Does the patient have breastfeeding experience prior to this delivery?: No Pumping frequency: initiated pumping at 9 hours post partum Pumped volume: 5 mL Flange Size: 21 Hands-free pumping top sizes: X-Large Chilton Si) Risk factor for low/delayed milk supply:: primipara, prematurity, PCOS, DM2, < 6 lbs, infant separation  Pump: Personal  (Spectra S2)  ASSESSMENT Infant: Feeding Status: Scheduled 9-12-3-6 Feeding method: Tube/Gavage (Bolus)  Maternal: Milk volume: Normal  INTERVENTIONS/PLAN Interventions: Interventions: Breast feeding basics reviewed; Coconut oil; DEBP; Education; NICU Pumping Log; CDC Guidelines for Breast Pump Cleaning; LC Services brochure Tools: Pump; Coconut oil; Flanges; Hands-free pumping top Pump Education: Setup, frequency, and cleaning; Milk Storage  Plan: Encouraged pumping every 3 hours, ideally 8 pumping sessions/24 hours Breast massage, hand expression and coconut oil were also encouraged prior pumping STS with baby "[name]" whenever possible  FOB and MGM present and very supportive. All questions and concerns answered, family to contact [facility] services PRN.  Consult Status: NICU follow-up NICU Follow-up type: New admission follow up   Taylorann Tkach S Laurice Iglesia 12/15/2022, 1:02 PM

## 2022-12-16 LAB — BIRTH TISSUE RECOVERY COLLECTION (PLACENTA DONATION)

## 2022-12-16 MED ORDER — DIPHENOXYLATE-ATROPINE 2.5-0.025 MG PO TABS
2.0000 | ORAL_TABLET | Freq: Four times a day (QID) | ORAL | Status: DC | PRN
  Administered 2022-12-16: 2 via ORAL
  Filled 2022-12-16: qty 2

## 2022-12-16 NOTE — Clinical Social Work Maternal (Signed)
CLINICAL SOCIAL WORK MATERNAL/CHILD NOTE  Patient Details  Name: [name] MRN: [id number] Date of Birth:   Date:  04-16-2022  Clinical Social Worker Initiating Note:  Vivi [place], [place] Date/Time: Initiated:  12/16/22/0900     Child's Name:  [name]   Biological Parents:  Mother, Father (MOB: Neysa Bonito Fortenberry 02/21/1998 FOB: Eula Fried 04/26/1994)   Need for Interpreter:  None   Reason for Referral:  Behavioral Health Concerns, Other (Comment) (NICU admission)   Address:  [address] [place] [address]    Phone number:  [phone] (home)     Additional phone number:   Household Members/Support Persons (HM/SP):   Household Member/Support Person 1   HM/SP Name Relationship DOB or Age  HM/SP -1 Doua Lear Spouse 04/26/1994  HM/SP -2        HM/SP -3        HM/SP -4        HM/SP -5        HM/SP -6        HM/SP -7        HM/SP -8          Natural Supports (not living in the home):  Parent, Extended Family   Professional Supports: None   Employment: Full-time   Type of Work: Radiographer, therapeutic   Education:  Other (comment) Occupational psychologist)   Homebound arranged:    Surveyor, quantity Resources:  Media planner    Other Resources:      Cultural/Religious Considerations Which May Impact Care:  [employer]  Strengths:  Ability to meet basic needs  , Home prepared for child  , Pediatrician chosen   Psychotropic Medications:         Pediatrician:    [employer] area  Optometrist List:   Management consultant [employer] [phone] [place])  [place]    [place]      Pediatrician Fax Number:    Risk Factors/Current Problems:  Mental Health Concerns     Cognitive State:  Able to Concentrate  , Goal Oriented  , Insightful  , Alert     Mood/Affect:  Comfortable  , Calm  , Interested     CSW Assessment: CSW received consult for bipolar, eating disorder , anxiety and  NICU admission. CSW met with MOB to offer support and complete assessment.    CSW met with MOB at bedside and introduced CSW role. CSW observed MOB in bed, FOB asleep, maternal grandmother and paternal grandmother present at bedside. CSW congratulated them all on baby girl, [name]. CSW offered to return later since MOB had visitors. MOB presented pleasant and welcomed CSW visit. CSW offered MOB privacy. MOB gave CSW permission share all information with her family present. CSW asked MOB how she has been feeling since giving birth. MOB reported that she has been feeling good and that she feels like she has been "moving off adrenaline."  MOB reported that she and FOB had been to NICU a few times to see the infant and that the staff had provided updates about the infant's care. CSW inquired about MOB supports. MOB identified her spouse, mother and mother-in-law as supports.   CSW inquired about MOB mental health history. MOB acknowledged that she has borderline personality disorder, an eating disorder, anxiety, and depression. MOB reported that her anxiety and depression symptoms have improved and are not as chronic as they use to be. MOB reported  that she has not self-harmed since year 2016.  MOB reported during the pregnancy she felt tired but was mostly in good spirits. MOB reported that she was able to cope well with her spouse's reassurance that everything was going to be ok. CSW inquired about the noted therapy treatment for eating disorder during the pregnancy. MOB reported the agency offered a certain medication treatment that she could not take during the pregnancy, so it was not very beneficial. MOB reported that she had success with Manjaro in the past with her eating disorder symptoms therefore she will resume the medication when the infant is weaned from breastfeeding. CSW offered to provide MOB with a list of mental health resources for eating disorders. MOB was thankful for the resources. MOB  reported that she was in therapy in 2021, and felt the sessions were effective because they worked on "internalizing and reflection." CSW inquired about the noted bipolar disorder. MOB reported that the psychiatrist "Nehemiah Settle" at [facility] treatment stated that she was misdiagnosed with bipolar but could not update MOB medical record because she did not have access to the medical record.  CSW provided education regarding the baby blues period vs. perinatal mood disorders, discussed treatment and gave additional resources for mental health follow up.CSW also recommended MOB complete a self-evaluation during the postpartum time period using the New Mom Checklist from Postpartum Progress and encouraged MOB to contact a medical professional if symptoms are noted at any time. MOB reported that she feels comfortable reaching out to her medical provider if concern arise. CSW assessed MOB for safety. MOB denied SI/HI.   CSW inquired if MOB had chosen a pediatrician for the infant. MOB has chosen [facility] [employer]. CSW inquire if MOB had essential items to care for the infant. MOB reported that essential items to care for the infant. CSW provided review of Sudden Infant Death Syndrome (SIDS) precautions. MOB reported understanding.   CSW offered weekly check-ins with MOB to offer support while the infant remains in the NICU. MOB agreed for social work to check-in. No Further Intervention Required/No Barriers to Discharge.   CSW Plan/Description:  Psychosocial Support and Ongoing Assessment of Needs, Perinatal Mood and Anxiety Disorder (PMADs) Education, Sudden Infant Death Syndrome (SIDS) Education, No Further Intervention Required/No Barriers to Discharge    Clearance Coots, LCSW 12/05/2022, 11:25 AM

## 2022-12-16 NOTE — Progress Notes (Signed)
Patient ID: LARISA GARSTKA, female   DOB: 2000-10-26, 22 y.o.   MRN: [id number]  POSTPARTUM PROGRESS NOTE  Subjective: RUDEAN AZBELL is a 22 y.o. [id number] s/p pLTCS at [redacted]w[redacted]d after IOL for uncontrolled T2DM.  She reports she doing well, pain a little increased today but manageable with meds. Baby is in NICU, she would like to stay another day to get on top of pain control. Requested abdominal binder  Objective: Blood pressure 127/70, pulse 84, temperature 98 F (36.7 C), temperature source Oral, resp. rate 16, height 5\' 5"  (1.651 m), weight 278 lb 11.2 oz (126.4 kg), last menstrual period 04/05/2022, SpO2 99%, unknown if currently breastfeeding.  Physical Exam:  General: alert, cooperative and no distress Chest: no respiratory distress, breasts filling, no nipple damage noted (pumping) Abdomen: soft, non-tender, no drainage noted on surgical dressing Uterine Fundus: firm, appropriately tender Extremities: No calf swelling or tenderness  mild edema Lochia: small to moderate  Recent Labs    12/15/22 0156 12/15/22 0606  HGB 10.1* 10.3*  HCT 30.6* 31.0*   Results for orders placed or performed during the hospital encounter of 12/13/22 (from the past 24 hours)  Glucose, capillary     Status: None   Collection Time: 12/15/22  8:43 PM  Result Value Ref Range   Glucose-Capillary 82 70 - 99 mg/dL   Comment 1 Notify RN    Comment 2 Document in Chart   Collect bld for placenta donatation     Status: None   Collection Time: 12/16/22  5:04 AM  Result Value Ref Range   Placenta donation bld collect Collected by Laboratory    Assessment/Plan: AMEE GLADSTONE is a 22 y.o. G3O7564 s/p pLTCS at [redacted]w[redacted]d for pLTCS.  Routine Postpartum Care: Doing well, pain well-controlled.  -- Continue routine care, lactation support  -- Contraception: POPs -- Feeding: pumping, baby in NICU - encouraged hand massage and regular pumping to help stimulate milk/colostrum  Dispo: Plan for discharge  tomorrow.  Edd Arbour, CNM, MSN, IBCLC Certified Nurse Midwife, [facility]

## 2022-12-16 NOTE — Inpatient Diabetes Management (Signed)
Inpatient Diabetes Program Recommendations  AACE/ADA: New Consensus Statement on Inpatient Glycemic Control (2015)  Target Ranges:  Prepandial:   less than 140 mg/dL      Peak postprandial:   less than 180 mg/dL (1-2 hours)      Critically ill patients:  140 - 180 mg/dL     Dexcom readings since [place]:   [id number]   Insulin Pump Settings as of this AM: Basal Rate: 1 unit/hr Total Basal per 24H period= 24 units  Carb Ratio= 1 unit for every 6 grams Carbs Correction Factor=  1 unit for every 30 mg/dl above Target CBG Target CBG 90   Met w/ pt again at bedside this AM.  Pump on and running.  Pt told me her PDM that controls her Insulin pump is not holding a charge and must be plugged in for charging at all times--Pump company to send new PDM STAT (may arrive tonight or over the weekend).  Discussed again with pt that she should reach out to Oran Rein for more insulin pump adjustments after discharge.  Discussed with pt that she may have significantly reduced insulin needs now that she has delivered.  Pt aware and told me Oran Rein talked with her about switching back to 70/30 Insulin BID after delivery and stopping the pump.  Asked pt to not make any pump changes by herself without talking with Oran Rein first.  Reminded pt that reducing the basal rate and increasing the Carb Ratio and Increasing the Correction factor will give her less insulin.  Reviewed signs and symptoms for Hypoglycemia and proper treatment of HYPO at home--Pt told me she usually takes glucose tabs for low CBGs.  Pt appreciative of visit and did not have any further questions for me at this time.     --Will follow patient during hospitalization--  Ambrose Finland RN, MSN, CDCES Diabetes Coordinator Inpatient Glycemic Control Team Team Pager: [phone] (8a-5p)

## 2022-12-16 NOTE — Progress Notes (Signed)
Pt was complaining that she feels crampy when peeing. This nurse did a bladder scan and checked 3 times. 0 to at most 8 ml showed in same area each time. This nurse asked if she was peeing a good amount or just a tiny bit, pt stated she believes she's peeing a good amount. Will let on coming Nightshift Nurse know, a Hat is in toilet to see how much urine pt. Is emptying out of bladder.

## 2022-12-16 NOTE — Anesthesia Postprocedure Evaluation (Signed)
Anesthesia Post Note  Patient: [name]  Procedure(s) Performed: CESAREAN SECTION     Patient location during evaluation: PACU Anesthesia Type: Epidural Level of consciousness: oriented and awake and alert Pain management: pain level controlled Vital Signs Assessment: post-procedure vital signs reviewed and stable Respiratory status: spontaneous breathing, respiratory function stable and patient connected to nasal cannula oxygen Cardiovascular status: blood pressure returned to baseline and stable Postop Assessment: no headache, no backache, no apparent nausea or vomiting and epidural receding Anesthetic complications: no   No notable events documented.  Last Vitals:  Vitals:   12/15/22 1826 12/16/22 0556  BP: 119/62 (!) 121/59  Pulse: 84 65  Resp:  18  Temp: 36.6 C 36.5 C  SpO2: 99% 99%    Last Pain:  Vitals:   12/16/22 0813  TempSrc:   PainSc: 7                  [name] L Kriste Broman

## 2022-12-17 DIAGNOSIS — Z98891 History of uterine scar from previous surgery: Secondary | ICD-10-CM

## 2022-12-17 LAB — TYPE AND SCREEN
ABO/RH(D): O POS
Antibody Screen: POSITIVE
Donor AG Type: NEGATIVE
Donor AG Type: NEGATIVE
Unit division: 0
Unit division: 0

## 2022-12-17 LAB — BPAM RBC
Blood Product Expiration Date: 202412272359
Blood Product Expiration Date: 202412292359
ISSUE DATE / TIME: 202412132212
Unit Type and Rh: 202412272359
Unit Type and Rh: 5100
Unit Type and Rh: 5100

## 2022-12-17 MED ORDER — FERROUS SULFATE 325 (65 FE) MG PO TABS: 325.0000 mg | ORAL_TABLET | ORAL | 1 refills | Status: DC

## 2022-12-17 MED ORDER — SLYND 4 MG PO TABS: 1.0000 | ORAL_TABLET | Freq: Every day | ORAL | 3 refills | Status: DC

## 2022-12-17 MED ORDER — ACETAMINOPHEN 500 MG PO TABS: 1000.0000 mg | ORAL_TABLET | Freq: Three times a day (TID) | ORAL | 1 refills | Status: DC | PRN

## 2022-12-17 MED ORDER — IBUPROFEN 800 MG PO TABS: 800.0000 mg | ORAL_TABLET | Freq: Three times a day (TID) | ORAL | 1 refills | Status: DC | PRN

## 2022-12-17 MED ORDER — OXYCODONE HCL 5 MG PO TABS: 5.0000 mg | ORAL_TABLET | Freq: Four times a day (QID) | ORAL | 0 refills | Status: DC | PRN

## 2022-12-17 MED ORDER — FUROSEMIDE 20 MG PO TABS: 20.0000 mg | ORAL_TABLET | Freq: Two times a day (BID) | ORAL | 0 refills | Status: DC

## 2022-12-17 MED ORDER — SENNOSIDES-DOCUSATE SODIUM 8.6-50 MG PO TABS: 2.0000 | ORAL_TABLET | Freq: Every evening | ORAL | 1 refills | Status: DC | PRN

## 2022-12-17 NOTE — Lactation Note (Signed)
This note was copied from a baby's chart.  NICU Lactation Consultation Note  Patient Name: [name] FAOZH'Y Date: 12/17/2022 Age:24 hours  Reason for consult: Follow-up assessment; NICU baby; Late-preterm 34-36.6wks; Primapara; 1st time breastfeeding; Infant < 6lbs; Maternal discharge; Hyperbilirubinemia; Exclusive pumping and bottle feeding Type of Endocrine Disorder?: Diabetes; PCOS (DM2 (insulin pump + metformin))  SUBJECTIVE Visited with family of 24 52/83 weeks old AGA NICU female; baby "[name]" is currently undergoing phototherapy, [name] reported she's been pumping consistently but only getting drops. Revised expectations and let her know that the purpose of pumping this early on is mainly for breast stimulation and not to get volume; praised her for all her efforts. Family voiced they have decided to exclusively pump and bottle feed but if at some points she feels "curios" about breastfeeding, she's aware NICU LCs can assist with latching and positioning. [name] is getting discharged today. Reviewed discharge education, pumping schedule, pump settings and anticipatory guidelines.   OBJECTIVE Infant data: Mother's Current Feeding Choice: Breast Milk and Donor Milk  O2 Device: Room Air FiO2 (%): 21 %  Infant feeding assessment Scale for Readiness: 2   Maternal data: [id number] C-Section, Low Transverse Pumping frequency: 6-7 times/24 hours Pumped volume: 0 mL (drops) Flange Size: 21 Hands-free pumping top sizes: X-Large Chilton Si)  Pump: Personal (Spectra S2)  ASSESSMENT Infant: Feeding Status: Scheduled 9-12-3-6 Feeding method: Tube/Gavage (Bolus)  Maternal: Milk volume: Normal  INTERVENTIONS/PLAN Interventions: Interventions: Breast feeding basics reviewed; Coconut oil; DEBP; Education Discharge Education: Engorgement and breast care Tools: Pump; Flanges; Hands-free pumping top; Coconut oil Pump Education: Setup, frequency, and cleaning; Milk  Storage  Plan: Encouraged pumping every 3 hours, ideally 8 pumping sessions/24 hours She'll take all pump parts to baby's room after her discharge She'll switch her pump settings from initiate to maintain once she starts getting 20 ml of EBM combined STS with baby "[name]" whenever possible   FOB, MGM and female visitor present and very supportive. All questions and concerns answered, family to contact [facility] services PRN.  Consult Status: NICU follow-up NICU Follow-up type: Verify absence of engorgement; Verify onset of copious milk   [name] 12/17/2022, 10:59 AM

## 2022-12-17 NOTE — Discharge Summary (Signed)
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 yo G2P0111 at [redacted]w[redacted]d was admitted to the hospital 12/13/2022 for induction of labor. Patient had a labor course significant for arrest of dilation. The patient went for cesarean section due to Arrest of Dilation. Delivery details are as follows: Membrane Rupture Time/Date: 4:18 AM,12/14/2022  Delivery Method:C-Section, Low Transverse Operative Delivery:N/A Details of operation can be found in separate operative Note.  Patient had a postpartum course complicated by none. She is ambulating, tolerating a regular diet, passing flatus, and urinating well. Pump settings managed per inpatient diabetes team and were able to be significantly decreased postpartum. Patient is discharged home in stable condition on 12/17/22.      Newborn Data: Birth  date:12/14/2022 Birth time:11:34 PM Gender:Female Living status:Living Apgars:8 ,9  Weight:2360 g                               Magnesium Sulfate received: No BMZ received: No Rhophylac:No MMR:No T-DaP:Given prenatally Flu: Yes RSV Vaccine received: Yes Transfusion:No  Immunizations received: Immunization History  Administered Date(s) Administered   DTaP 04/22/1998, 06/24/1998, 08/31/1998, 08/20/1999, 07/31/2002   HIB (PRP-OMP) 04/22/1998, 06/24/1998, 08/31/1998, 08/20/1999   HIB, Unspecified 04/22/1998, 06/24/1998, 08/31/1998, 08/20/1999   HPV Quadrivalent 04/18/2011, 01/27/2012, 02/12/2013   Hepatitis A 08/24/2007, 08/18/2009   Hepatitis A, Ped/Adol-2 Dose 08/24/2007, 08/18/2009   Hepatitis B 03/18/1998, 04/22/1998, 12/14/1998   IPV 04/22/1998, 06/24/1998, 12/14/1998, 07/31/2002   Influenza, Seasonal, Injecte, Preservative Fre 10/31/2022   Influenza,inj,Quad PF,6+ Mos 02/09/2015, 09/10/2015   Influenza-Unspecified 01/27/2012, 02/09/2015, 09/10/2015   MMR 02/26/1999, 07/31/2002   Meningococcal Acwy, Unspecified 08/18/2009, 11/10/2014   Meningococcal Conjugate 08/18/2009, 11/10/2014   Pneumococcal Polysaccharide-23 05/06/1998, 07/08/1998, 08/31/1998, 02/26/1999, 08/05/2020   Pneumococcal-Unspecified 05/06/1998, 07/08/1998, 08/31/1998, 02/26/1999   Rsv, Bivalent, Protein Subunit Rsvpref,pf Verdis Frederickson) 11/14/2022   Td 08/18/2009   Tdap 08/18/2009, 08/05/2020, 10/18/2022   Varicella 02/26/1999, 08/18/2009    Physical exam  Vitals:   12/16/22 1030 12/16/22 1814 12/16/22 2100 12/17/22 0512  BP: 127/70 114/64 (!) 122/48 123/87  Pulse: 84 72 85 81  Resp: 16 17 18 18   Temp: 98 F (36.7 C) 98.1 F (36.7 C) 98.7 F (37.1 C) 98 F (36.7 C)  TempSrc: Oral Oral Oral Oral  SpO2:  98% 99% 100%  Weight:      Height:       General: alert, cooperative, and no distress Lochia: appropriate Uterine Fundus: firm Incision: Prevena in place DVT Evaluation: No evidence of DVT seen  on physical exam. Labs: Lab Results  Component Value Date   WBC 15.1 (H) 12/15/2022   HGB 10.3 (L) 12/15/2022   HCT 31.0 (L) 12/15/2022   MCV 91.4 12/15/2022   PLT 236 12/15/2022      Latest Ref Rng & Units 12/15/2022    6:06 AM  CMP  Glucose 70 - 99 mg/dL 97   BUN 6 - 20 mg/dL <5   Creatinine 2.95 - 1.00 mg/dL 6.21   Sodium 308 - 657 mmol/L 136   Potassium 3.5 - 5.1 mmol/L 3.7   Chloride 98 - 111 mmol/L 107   CO2 22 - 32 mmol/L 23   Calcium 8.9 - 10.3 mg/dL 8.6   Total Protein 6.5 - 8.1 g/dL 5.6   Total Bilirubin <8.4 mg/dL 0.7   Alkaline Phos 38 - 126 U/L 72   AST 15 - 41 U/L 18   ALT 0 - 44 U/L 20    Edinburgh Score:    12/15/2022    5:01 PM  Edinburgh Postnatal Depression Scale Screening Tool  I have been able to laugh and see the funny side of things. 0  I have looked forward with enjoyment to things. 0  I have blamed myself unnecessarily when things went wrong. 1  I have been anxious or worried for no good reason. 0  I have felt scared or panicky for no good reason. 0  Things have been getting on top of me. 0  I have been so unhappy that I have had difficulty sleeping. 0  I have felt sad or miserable. 0  I have been so unhappy that I have been crying. 1  The thought of harming myself has occurred to me. 0  Edinburgh Postnatal Depression Scale Total 2   No data recorded  After visit meds:  Allergies as of 12/17/2022   No Known Allergies      Medication List     STOP taking these medications    aspirin EC 81 MG tablet   clindamycin 1 % external solution Commonly known as: CLEOCIN T   ondansetron 4 MG disintegrating tablet Commonly known as: ZOFRAN-ODT       TAKE these medications    acetaminophen 500 MG tablet Commonly known as: TYLENOL Take 2 tablets (1,000 mg total) by mouth every 8 (eight) hours as needed.   Dexcom G7 Sensor Misc Please dispense 30 day supply of dexcom 7 sensors.  3 sensors.   ferrous sulfate 325 (65 FE) MG  tablet Take 1 tablet (325 mg total) by mouth every other day.   furosemide 20 MG tablet Commonly known as: LASIX Take 1 tablet (20 mg total) by mouth 2 (two) times daily.   ibuprofen 800 MG tablet Commonly known as: ADVIL Take 1 tablet (800 mg total) by mouth every 8 (eight) hours as needed.   metFORMIN 500 MG tablet Commonly known as: GLUCOPHAGE Take 2 tablets (1,000 mg total) by mouth 2 (two) times daily with a meal.   metoprolol succinate 50 MG 24 hr tablet Commonly known as: TOPROL-XL Take 1 tablet (50 mg total) by mouth daily.   multivitamin-prenatal 27-0.8 MG Tabs tablet Take 1 tablet by mouth daily at 12 noon.   NovoLOG 100 UNIT/ML injection Generic drug: insulin  aspart Use 120 units daily with insulin pump with max daily amount of 140 units daily   Omnipod DASH Pods (Gen 4) Misc Please dispense 30 day supply of omnipods.  Pt is using between 115 - 140 each day.  Pt currently using up to 21 pods each month.   oxyCODONE 5 MG immediate release tablet Commonly known as: Oxy IR/ROXICODONE Take 1 tablet (5 mg total) by mouth every 6 (six) hours as needed for moderate pain (pain score 4-6).   senna-docusate 8.6-50 MG tablet Commonly known as: Senokot-S Take 2 tablets by mouth at bedtime as needed for mild constipation.   Slynd 4 MG Tabs Generic drug: Drospirenone Take 1 tablet (4 mg total) by mouth daily.         Discharge home in stable condition Infant Feeding:  both Infant Disposition:NICU Discharge instruction: per After Visit Summary and Postpartum booklet. Activity: Advance as tolerated. Pelvic rest for 6 weeks.  Diet: routine diet Future Appointments: Future Appointments  Date Time Provider Department Center  12/22/2022 10:00 AM Thomasene Ripple, DO [facility] None  12/23/2022 10:15 AM [facility] NURSE [facility]  01/26/2023  8:15 AM [facility] LAB [facility]  01/26/2023  8:35 AM Jerene Bears, MD [facility]   12/17/2022 Joanne Gavel, MD [facility]

## 2022-12-19 ENCOUNTER — Encounter (HOSPITAL_BASED_OUTPATIENT_CLINIC_OR_DEPARTMENT_OTHER): Payer: Self-pay | Admitting: Obstetrics & Gynecology

## 2022-12-20 ENCOUNTER — Encounter (HOSPITAL_BASED_OUTPATIENT_CLINIC_OR_DEPARTMENT_OTHER): Payer: 59 | Admitting: Obstetrics & Gynecology

## 2022-12-20 ENCOUNTER — Ambulatory Visit (HOSPITAL_COMMUNITY): Payer: Self-pay

## 2022-12-20 NOTE — Lactation Note (Signed)
This note was copied from a baby's chart.  NICU Lactation Consultation Note  Patient Name: [name] Date: 12/20/2022 Age:24 days  Reason for consult: Weekly NICU follow-up; Primapara; 1st time breastfeeding; NICU baby; Infant < 6lbs; Exclusive pumping and bottle feeding; Mother's request; RN request Type of Endocrine Disorder?: Diabetes; PCOS (DM2 (insulin pump + metformin))  SUBJECTIVE Visited with family of 40 94/74 weeks old AGA NICU female; [name] is a P1 and reported her milk came in, she's been pumping consistently, praised her for her efforts. She called out for lactation because she tried to # 24 flanges but they still felt uncomfortable, she's was using the # 21 flanges but when she noticed some pain/discomfort, she switched to a # 24 but it didn't get better, it actually got worse. She has been using her Spectra pump at home and it only comes with # 24 and # 28 flanges, discussed about getting inserts. Sized her with # 21 flanges as today and advised to start using the coconut oil inside the hard plastic on the flange instead of doing in just on the nipple area. Offered to observe a pumping session but she voiced she wasn't ready to pump yet, asked her to call for assistance the next time she comes to the hospital if the # 21 flanges are not helping.    OBJECTIVE Infant data: Mother's Current Feeding Choice: Breast Milk and Donor Milk  O2 Device: Room Air  Infant feeding assessment Scale for Readiness(Retired-Do Not Use): 2 Scale for Quality (Retired-Do Not Use): 4   Maternal data: [id number] C-Section, Low Transverse Pumping frequency: 7-8 times/24 hours Pumped volume: 60 mL Flange Size: 21 (Tried # 24 flanges but they were uncomfortable) Hands-free pumping top sizes: X-Large Chilton Si)  Pump: Personal (Spectra S2)  ASSESSMENT Infant: Feeding Status: Scheduled 9-12-3-6 Feeding method: Bottle; Tube/Gavage (Bolus) Nipple Type: Dr. Levert Feinstein  Preemie  Maternal: Milk volume: Normal  INTERVENTIONS/PLAN Interventions: Interventions: Breast feeding basics reviewed; Coconut oil; Education; DEBP Tools: Pump; Flanges; Coconut oil; Hands-free pumping top Pump Education: Setup, frequency, and cleaning; Milk Storage  Plan: Encouraged pumping every 3 hours, ideally 8 pumping sessions/24 hours She'll start using coconut oil inside the flange in addition of the nipple area and will consider using inserts for her Spectra pump at home STS with baby "[name]" whenever possible   MGM present and very supportive. All questions and concerns answered, family to contact [facility] services PRN.  Consult Status: NICU follow-up NICU Follow-up type: Weekly NICU follow up   Emin Foree S Philis Nettle 12/20/2022, 2:13 PM

## 2022-12-22 ENCOUNTER — Ambulatory Visit: Payer: 59 | Admitting: Cardiology

## 2022-12-22 ENCOUNTER — Telehealth (HOSPITAL_COMMUNITY): Payer: Self-pay | Admitting: *Deleted

## 2022-12-22 NOTE — Telephone Encounter (Signed)
12/22/2022  Name: [name] MRN: [id number] DOB: 24-Jul-2000  Reason for Call:  Transition of Care Hospital Discharge Call  Contact Status: Patient Contact Status: Complete  Language assistant needed: Interpreter Mode: Interpreter Not Needed        Follow-Up Questions: Do You Have Any Concerns About Your Health As You Heal From Delivery?: No Do You Have Any Concerns About Your Infants Health?: Infant in NICU  Edinburgh Postnatal Depression Scale:  In the Past 7 Days: I have been able to laugh and see the funny side of things.: As much as I always could I have looked forward with enjoyment to things.: Rather less than I used to I have blamed myself unnecessarily when things went wrong.: Yes, some of the time I have been anxious or worried for no good reason.: No, not at all I have felt scared or panicky for no good reason.: No, not at all Things have been getting on top of me.: No, I have been coping as well as ever I have been so unhappy that I have had difficulty sleeping.: Yes, sometimes I have felt sad or miserable.: Not very often I have been so unhappy that I have been crying.: Only occasionally The thought of harming myself has occurred to me.: Never Edinburgh Postnatal Depression Scale Total: 7  PHQ2-9 Depression Scale:     Discharge Follow-up: Edinburgh score requires follow up?: No Patient was advised of the following resources:: Support Group, Breastfeeding Support Group  Post-discharge interventions: Reviewed Newborn Safe Sleep Practices  Salena Saner, RN 12/22/2022 14:07

## 2022-12-23 ENCOUNTER — Encounter (HOSPITAL_BASED_OUTPATIENT_CLINIC_OR_DEPARTMENT_OTHER): Payer: Self-pay | Admitting: Obstetrics & Gynecology

## 2022-12-23 ENCOUNTER — Ambulatory Visit (INDEPENDENT_AMBULATORY_CARE_PROVIDER_SITE_OTHER): Payer: 59 | Admitting: Obstetrics & Gynecology

## 2022-12-23 VITALS — BP 118/52 | HR 66 | Wt 252.4 lb

## 2022-12-23 DIAGNOSIS — Z09 Encounter for follow-up examination after completed treatment for conditions other than malignant neoplasm: Secondary | ICD-10-CM

## 2022-12-23 DIAGNOSIS — Z20828 Contact with and (suspected) exposure to other viral communicable diseases: Secondary | ICD-10-CM

## 2022-12-23 NOTE — Progress Notes (Signed)
GYNECOLOGY  VISIT  CC:   post op recheck  HPI: 22 y.o. G2P0111 Married White or Caucasian female here for recheck after undergoing primary LTCS on 12/13/2022 due to FTP after induction at 36 weeks due to poorly controlled diabetes.  Bleeding is decreasing and not heavy.  Pain is under good control.  Had VAC placed on incision.  Having minimal drainage.  Needs removed today.    Reports daughter diagnosed with possible SCID.  CMV testing recommended by NICU providers.  Will draw this today.  MEDS:   Current Outpatient Medications on File Prior to Visit  Medication Sig Dispense Refill   acetaminophen (TYLENOL) 500 MG tablet Take 2 tablets (1,000 mg total) by mouth every 8 (eight) hours as needed. 30 tablet 1   Continuous Glucose Sensor (DEXCOM G7 SENSOR) MISC Please dispense 30 day supply of dexcom 7 sensors.  3 sensors. 3 each 6   Drospirenone (SLYND) 4 MG TABS Take 1 tablet (4 mg total) by mouth daily. 90 tablet 3   ferrous sulfate 325 (65 FE) MG tablet Take 1 tablet (325 mg total) by mouth every other day. 45 tablet 1   furosemide (LASIX) 20 MG tablet Take 1 tablet (20 mg total) by mouth 2 (two) times daily. 3 tablet 0   ibuprofen (ADVIL) 800 MG tablet Take 1 tablet (800 mg total) by mouth every 8 (eight) hours as needed. 30 tablet 1   insulin aspart (NOVOLOG) 100 UNIT/ML injection Use 120 units daily with insulin pump with max daily amount of 140 units daily 50 mL 3   Insulin Disposable Pump (OMNIPOD DASH PODS, GEN 4,) MISC Please dispense 30 day supply of omnipods.  Pt is using between 115 - 140 each day.  Pt currently using up to 21 pods each month. 20 each 2   metFORMIN (GLUCOPHAGE) 500 MG tablet Take 2 tablets (1,000 mg total) by mouth 2 (two) times daily with a meal. 120 tablet 1   metoprolol succinate (TOPROL-XL) 50 MG 24 hr tablet Take 1 tablet (50 mg total) by mouth daily. 90 tablet 2   oxyCODONE (OXY IR/ROXICODONE) 5 MG immediate release tablet Take 1 tablet (5 mg total) by mouth  every 6 (six) hours as needed for moderate pain (pain score 4-6). 20 tablet 0   Prenatal Vit-Fe Fumarate-FA (MULTIVITAMIN-PRENATAL) 27-0.8 MG TABS tablet Take 1 tablet by mouth daily at 12 noon.     senna-docusate (SENOKOT-S) 8.6-50 MG tablet Take 2 tablets by mouth at bedtime as needed for mild constipation. 60 tablet 1   No current facility-administered medications on file prior to visit.    SH:  Smoking No    PHYSICAL EXAMINATION:    BP (!) 118/52   Pulse 66   Wt 252 lb 6.4 oz (114.5 kg)   LMP 04/05/2022 (Exact Date)   BMI 42.00 kg/m     General appearance: alert, cooperative and appears stated age Abdomen: soft, non-tender Incisions:  Vac pump turned off, dressing removed.  Pt tolerated this well.     Assessment/Plan: 1. Postop check (Primary) - recheck 4-5 weeks for post partum appointment - she is still using her pump.  Basal insulin rate has been decreased and she is using 1:6 ratio for carb counting.  Insulin rate is much decreased.    2. 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

## 2022-12-24 LAB — CMV ABS, IGG+IGM (CYTOMEGALOVIRUS)
CMV Ab - IgG: 8.2 U/mL — ABNORMAL HIGH (ref 0.00–0.59)
CMV IgM Ser EIA-aCnc: <30 [AU]/ml (ref 0.0–29.9)

## 2023-01-10 ENCOUNTER — Ambulatory Visit (HOSPITAL_COMMUNITY): Payer: Self-pay

## 2023-01-10 NOTE — Lactation Note (Signed)
 This note was copied from a baby's chart.  NICU Lactation Consultation Note  Patient Name: [name] Date: 01/10/2023 Age:25 wk.o.  Reason for consult: Follow-up assessment; NICU baby; Maternal endocrine disorder; Late-preterm 34-36.6wks; Infant < 6lbs; Primapara; 1st time breastfeeding; RN request Type of Endocrine Disorder?: Diabetes; PCOS  SUBJECTIVE  RN called requesting LC come speak with P1 Mom of baby [name].  Mom was asking about donating her milk due to her abundant supply.  LC recommended she freeze EBM in the back of her freezer, if the INC is asking for her to not leave any EBM due to large volume.  Mom states they haven't told her that, but they are getting close.  Mom encouraged to continue her consistent pumping, larger bottles provided, as her baby would be growing and needing more milk.  Reassured her that she is blessed with a good milk supply.  No problems with engorgement or pain.  LC asked Mom if she was planning to breastfeed.  Mom stated that she would think about that after babies discharge.  Shared that Pamela Forster RN IBCLC at the [facility] was wonderful and would be an option for OP lactation assistance.  Mom also aware of assistance while baby is in the NICU.  OBJECTIVE Infant data: No data recorded O2 Device: Room Air  Infant feeding assessment IDFTS - Readiness: 2 IDFTS - Quality: 2   Maternal data: [id number] C-Section, Low Transverse Pumping frequency: 8 times per 24 hrs Pumped volume: 120 mL (120-180 ml) Flange Size: 21 Hands-free pumping top sizes: X-Large [name])  Pump: Personal (Spectra  S2)  ASSESSMENT Infant:  Feeding Status: Scheduled 9-12-3-6 Feeding method: Bottle; Tube/Gavage (Bolus) Nipple Type: Dr. Jonna Lindsey Preemie  Maternal: Milk volume: Abundant  INTERVENTIONS/PLAN Interventions: Interventions: Skin to skin; Breast massage; Hand express; Coconut oil; DEBP; Education Tools: Pump; Flanges; 8 oz.  bottles; Hands-free pumping top  Plan: Consult Status: NICU follow-up NICU Follow-up type: Weekly NICU follow up   [name] 01/10/2023, 1:56 PM

## 2023-01-12 ENCOUNTER — Encounter (HOSPITAL_BASED_OUTPATIENT_CLINIC_OR_DEPARTMENT_OTHER): Payer: Self-pay | Admitting: Obstetrics & Gynecology

## 2023-01-12 NOTE — Telephone Encounter (Signed)
 Called pt in response to myChart message and provided pt with appt for evaluation. Advised pt that if the incision separates, drainage increases, or she develops a fever overnight, she should go to [facility] for evaluation. Pt verbalized understanding.

## 2023-01-13 ENCOUNTER — Ambulatory Visit (INDEPENDENT_AMBULATORY_CARE_PROVIDER_SITE_OTHER): Payer: 59 | Admitting: Obstetrics & Gynecology

## 2023-01-13 VITALS — BP 118/65 | HR 91 | Ht 65.0 in | Wt 245.4 lb

## 2023-01-13 DIAGNOSIS — N99842 Postprocedural seroma of a genitourinary system organ or structure following a genitourinary system procedure: Secondary | ICD-10-CM

## 2023-01-13 DIAGNOSIS — Z98891 History of uterine scar from previous surgery: Secondary | ICD-10-CM

## 2023-01-13 DIAGNOSIS — Z794 Long term (current) use of insulin: Secondary | ICD-10-CM

## 2023-01-13 DIAGNOSIS — E119 Type 2 diabetes mellitus without complications: Secondary | ICD-10-CM
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
## 2023-01-16 ENCOUNTER — Encounter (HOSPITAL_BASED_OUTPATIENT_CLINIC_OR_DEPARTMENT_OTHER): Payer: Self-pay | Admitting: Obstetrics & Gynecology

## 2023-01-18 ENCOUNTER — Encounter (HOSPITAL_BASED_OUTPATIENT_CLINIC_OR_DEPARTMENT_OTHER): Payer: Self-pay | Admitting: Obstetrics & Gynecology

## 2023-01-18 ENCOUNTER — Ambulatory Visit (INDEPENDENT_AMBULATORY_CARE_PROVIDER_SITE_OTHER): Payer: Self-pay | Admitting: Obstetrics & Gynecology

## 2023-01-18 VITALS — BP 124/52 | HR 85 | Wt 241.0 lb

## 2023-01-18 DIAGNOSIS — Z98891 History of uterine scar from previous surgery: Secondary | ICD-10-CM

## 2023-01-18 DIAGNOSIS — E1165 Type 2 diabetes mellitus with hyperglycemia: Secondary | ICD-10-CM

## 2023-01-18 DIAGNOSIS — T8131XD Disruption of external operation (surgical) wound, not elsewhere classified, subsequent encounter: Secondary | ICD-10-CM

## 2023-01-18 DIAGNOSIS — Z794 Long term (current) use of insulin: Secondary | ICD-10-CM

## 2023-01-21 NOTE — Progress Notes (Signed)
GYNECOLOGY  VISIT  CC:   post op recheck  HPI: 23 y.o. G2P0111 Married White or Caucasian female here for recheck after having superficial opening of c section incision due to small seroma.  Packing was placed and removed by pt the next day.  Had planned repacking in MAU but due to snow and daughter being released from NICU, that was not done.  Here for recheck.  Reports still having drainage and she thinks there is another small area on the left side of incision that may be opening as well.  Denies bleeding.  No fever.  No odor with drainage.    Has been called by endocrinology for scheduling.  She has not called back due to having daughter home.    Having some continued menstrual bleeding. Not heavy.  Will pass small clots with wiping.  Breastfeeding.   MEDS:   Current Outpatient Medications on File Prior to Visit  Medication Sig Dispense Refill   Continuous Glucose Sensor (DEXCOM G7 SENSOR) MISC Please dispense 30 day supply of dexcom 7 sensors.  3 sensors. 3 each 6   Drospirenone (SLYND) 4 MG TABS Take 1 tablet (4 mg total) by mouth daily. 90 tablet 3   ferrous sulfate 325 (65 FE) MG tablet Take 1 tablet (325 mg total) by mouth every other day. 45 tablet 1   ibuprofen (ADVIL) 800 MG tablet Take 1 tablet (800 mg total) by mouth every 8 (eight) hours as needed. 30 tablet 1   insulin aspart (NOVOLOG) 100 UNIT/ML injection Use 120 units daily with insulin pump with max daily amount of 140 units daily 50 mL 3   Insulin Disposable Pump (OMNIPOD DASH PODS, GEN 4,) MISC Please dispense 30 day supply of omnipods.  Pt is using between 115 - 140 each day.  Pt currently using up to 21 pods each month. 20 each 2   metoprolol succinate (TOPROL-XL) 50 MG 24 hr tablet Take 1 tablet (50 mg total) by mouth daily. 90 tablet 2   senna-docusate (SENOKOT-S) 8.6-50 MG tablet Take 2 tablets by mouth at bedtime as needed for mild constipation. 60 tablet 1   No current facility-administered medications on file  prior to visit.    SH:  Smoking No    PHYSICAL EXAMINATION:    BP (!) 124/52 (BP Location: Left Arm, Patient Position: Sitting, Cuff Size: Large)   Pulse 85   Wt 241 lb (109.3 kg)   LMP 04/05/2022 (Exact Date)   BMI 40.10 kg/m     General appearance: alert, cooperative and appears stated age Abdomen: soft, non-tender; bowel sounds normal; no masses,  no organomegaly Incisions:  C/D/I, area that was opened on the right is probed and has closed significantly.  No packing needed.  Good granulation tissue noted.  Left side of incision with area where suture coming through.  This was elevated and cut.  Incision not open on this side.  Probed.  Did not open any more.  Assessment/Plan: 1. S/P cesarean section (Primary) - incision looks good today.  Care reviewed--soap and water and keep dry.  - has post partum appt next week so will keep this appt - no indication for antibiotics at this time  2. Dehiscence of operative wound, superficial  3. S/P cesarean section  4. Type 2 diabetes mellitus with hyperglycemia, with long-term current use of insulin (HCC) - referral to endocrinology has been placed and patient has been called for appt

## 2023-01-26 ENCOUNTER — Other Ambulatory Visit (HOSPITAL_BASED_OUTPATIENT_CLINIC_OR_DEPARTMENT_OTHER): Payer: 59

## 2023-01-26 ENCOUNTER — Ambulatory Visit (HOSPITAL_BASED_OUTPATIENT_CLINIC_OR_DEPARTMENT_OTHER): Payer: Managed Care, Other (non HMO) | Admitting: Obstetrics & Gynecology

## 2023-01-26 ENCOUNTER — Encounter (HOSPITAL_BASED_OUTPATIENT_CLINIC_OR_DEPARTMENT_OTHER): Payer: Self-pay | Admitting: Obstetrics & Gynecology

## 2023-01-26 DIAGNOSIS — E1165 Type 2 diabetes mellitus with hyperglycemia: Secondary | ICD-10-CM

## 2023-01-26 DIAGNOSIS — Z98891 History of uterine scar from previous surgery: Secondary | ICD-10-CM | POA: Diagnosis not present

## 2023-01-26 DIAGNOSIS — F419 Anxiety disorder, unspecified: Secondary | ICD-10-CM | POA: Diagnosis not present

## 2023-01-26 DIAGNOSIS — Z794 Long term (current) use of insulin: Secondary | ICD-10-CM

## 2023-01-26 DIAGNOSIS — O24113 Pre-existing diabetes mellitus, type 2, in pregnancy, third trimester: Secondary | ICD-10-CM

## 2023-01-26 NOTE — Progress Notes (Signed)
Post Partum Visit Note  [name] is a 23 y.o. [phone] female who presents for a postpartum visit. She is 6 weeks postpartum following a primary cesarean section.  I have fully reviewed the prenatal and intrapartum course. The delivery was at 36 2/7 gestational weeks.  Anesthesia: epidural.  Postpartum course has been good.  Daughter was in the NICU about 4 weeks.  She is home now and doing well except for some colic.  Baby is feeding by  fortified breast milk.  Currently pumping for this . Bleeding  was irregular for several weeks.    Bowel function is normal. Bladder function is normal. Patient is not sexually active. Contraception method is oral progesterone-only contraceptive. Postpartum depression screening: negative but she is having a lot of anxiety.  Feels it's gotten a little better.  Some of her anxiety is about having a newborn and some of it is due to her daughter being in the NICU.    Health Maintenance Due  Topic Date Due   FOOT EXAM  Never done   OPHTHALMOLOGY EXAM  Never done   Pneumococcal Vaccine 55-29 Years old (2 of 2 - PCV) 08/05/2021   COVID-19 Vaccine (1 - 2024-25 season) Never done   HEMOGLOBIN A1C  10/22/2022    The following portions of the patient's history were reviewed and updated as appropriate: allergies, current medications, past family history, past medical history, past social history, past surgical history, and problem list.  Review of Systems Pertinent items noted in HPI and remainder of comprehensive ROS otherwise negative.  Objective:  BP (!) 109/58 (BP Location: Left Arm, Patient Position: Sitting, Cuff Size: Large)   Pulse 87   Ht 5\' 5"  (1.651 m)   Wt 243 lb 3.2 oz (110.3 kg)   LMP 04/05/2022 (Exact Date)   Breastfeeding Yes   BMI 40.47 kg/m    General:  alert and no distress   Breasts:  normal  Lungs: clear to auscultation bilaterally  Heart:  regular rate and rhythm, S1, S2 normal, no murmur, click, rub or gallop  Abdomen: soft,  non-tender; bowel sounds normal; no masses,  no organomegaly   Wound well approximated incision  GU exam:   NAEFG, vagina without lesions, cervix closed, small amount of blood present, Uterus normal and non tender       Assessment:  1. Postpartum care and examination (Primary)  2. Pre-existing type 2 diabetes mellitus during pregnancy in third trimester  3. S/P cesarean section  4. Anxiety - Ambulatory referral to Integrated Behavioral Health   Plan:   Essential components of care per ACOG recommendations:  1.  Mood and well being: Patient with negative depression screening today. Reviewed local resources for support.  - Patient tobacco use? No.   - hx of drug use? No.    2. Infant care and feeding:  -Patient currently breastmilk feeding? No.  -Social determinants of health (SDOH) reviewed in EPIC. No concerns. \  3. Sexuality, contraception and birth spacing - Patient does not want a pregnancy in the next year.  Desired family size is 2 children.  - Reviewed reproductive life planning. Reviewed contraceptive methods based on pt preferences and effectiveness.  Patient on POPs. - Discussed birth spacing of 18 months  4. Sleep and fatigue -Encouraged family/partner/community support of 4 hrs of uninterrupted sleep to help with mood and fatigue  5. Physical Recovery  - Discussed patients delivery and complications. - Patient had a C-section.  - Patient has urinary incontinence?  No. - Patient is safe to resume physical and sexual activity  6.  Health Maintenance - HM due items addressed Yes - Last pap smear  Diagnosis  Date Value Ref Range Status  04/11/2022 (A)  Final   - Atypical squamous cells of undetermined significance (ASC-US)   Pap smear not done at today's visit.  -Breast Cancer screening indicated? No.   7. Chronic Disease/Pregnancy Condition follow up:  type 2 diabetes.  - referral to endocrinology has been made - she has follow up with Oran Rein next  week - PCP follow up  Jerene Bears, MD [facility], [facility]

## 2023-01-26 NOTE — Patient Instructions (Signed)
[facility] Address: [address] [place], [place] [address] Phone: [phone]

## 2023-02-02 ENCOUNTER — Encounter: Payer: Managed Care, Other (non HMO) | Attending: Obstetrics & Gynecology | Admitting: Dietician

## 2023-02-02 DIAGNOSIS — E1165 Type 2 diabetes mellitus with hyperglycemia: Secondary | ICD-10-CM | POA: Insufficient documentation

## 2023-02-02 DIAGNOSIS — Z794 Long term (current) use of insulin: Secondary | ICD-10-CM | POA: Diagnosis present

## 2023-02-02 NOTE — Patient Instructions (Signed)
Include protein with each meal and snack Think of nutrition quality when eating a snack or meal Consider simple meal ideas and meal planning prior to shopping Choose low fat meals most frequently Aim for 1/2 your plate non-starchy vegetables Aim for a 30 minute walk most days

## 2023-02-02 NOTE — Progress Notes (Signed)
Diabetes Self-Management Education  Visit Type: Follow-up  Appt. Start Time: 1105 Appt. End Time: 1140  02/02/2023  [name], identified by name and date of birth, is a 23 y.o. female with a diagnosis of Diabetes:  .   ASSESSMENT  Patient is here today with her mom and infant.  [name] removed the insulin pump 3-4 weeks ago as she was getting low with bolusing and then would forget to change the POD. She is currently on no medication for diabetes. She asked about restarting Mounjaro.  Discussed that this is not recommended with breastfeeding. She has not not picked up her Dexcom due to being busy. Blood glucose 143 in the office this am 3 hours after breakfast and right after a snack this am. Just got over a stomach  virus for the past 2 days.  History includes Type 2 diabetes (2015/16), PCOS, anxiety, bipolar affective, hypothyroidism Medications:  MVI Labs:  A1c 5.3% 04/22/2022 (on [place])  Patient lives with her husband and child.  She will return to work at the end of February.  Her mom is helping out a lot. [name] was born at 36 week at 5 lbs 3 oz and is now 7 weeks 7 lbs 4 oz.  [name] is anxious because [name] is not growing at the rate that she needs.  She is to see someone regarding this.  [name] is pumping and is feeding [name] breast milk with neosure fortifier to boost the calories.  Pt states about 3 years ago she had an issue where she felt her throat was physically closing up so she would eat or drink water to the point of throwing up; stating eating in that moment made her feel her throat was not actually closing and would eat or drink so much it would cause her to throw up. Pt states she has not vomited purposefully in the last 3 years and denies feeling guilt or shame after eating. Denies stress eating today.   Diabetes Self-Management Education - 02/02/23 1250       Visit Information   Visit Type Follow-up      Psychosocial Assessment    Patient Belief/Attitude about Diabetes Motivated to manage diabetes    Self-care barriers None    Self-management support Doctor's office;Family;CDE visits    Other persons present Patient    Patient Concerns Nutrition/Meal planning;Healthy Lifestyle    Special Needs None    Preferred Learning Style No preference indicated    Learning Readiness Ready    How often do you need to have someone help you when you read instructions, pamphlets, or other written materials from your doctor or pharmacy? 1 - Never    What is the last grade level you completed in school? Associate's degree      Pre-Education Assessment   Patient understands the diabetes disease and treatment process. Needs Review    Patient understands incorporating nutritional management into lifestyle. Needs Review    Patient undertands incorporating physical activity into lifestyle. Needs Review    Patient understands using medications safely. Needs Review    Patient understands monitoring blood glucose, interpreting and using results Needs Review    Patient understands prevention, detection, and treatment of acute complications. Needs Review    Patient understands prevention, detection, and treatment of chronic complications. Needs Review    Patient understands how to develop strategies to address psychosocial issues. Needs Review    Patient understands how to develop strategies to promote health/change behavior. Needs Review  Complications   Last HgB A1C per patient/outside source 5.3 %   04/19/2022   How often do you check your blood sugar? 0 times/day (not testing)    Postprandial Blood glucose range (mg/dL) [phone]    Number of hypoglycemic episodes per month 0    Have you had a dilated eye exam in the past 12 months? No    Have you had a dental exam in the past 12 months? No    Are you checking your feet? Yes    How many days per week are you checking your feet? 1      Dietary Intake   Breakfast grits with butter     Snack (morning) egg bites    Lunch sandwich or corn dog    Snack (afternoon) jerky or english muffin    Dinner soup, rice    Snack (evening) none last night but snacks if she wakes up hungry at times    Beverage(s) water, Pedialyte packets (5 grams carbs), occasional coffee, occasional juice, sugar free gatorade      Activity / Exercise   Activity / Exercise Type ADL's      Patient Education   Previous Diabetes Education Yes (please comment)   fall 2024   Disease Pathophysiology Other (comment)   reviewed insulin resistance   Healthy Eating Plate Method;Role of diet in the treatment of diabetes and the relationship between the three main macronutrients and blood glucose level;Meal options for control of blood glucose level and chronic complications.    Being Active Role of exercise on diabetes management, blood pressure control and cardiac health.    Medications Other (comment)   discussed to begin checking her blood glucose - discussed that there are medications that are acceptible with breast feeding if needed   Monitoring Purpose and frequency of SMBG.;Identified appropriate SMBG and/or A1C goals.;Yearly dilated eye exam    Chronic complications Relationship between chronic complications and blood glucose control    Diabetes Stress and Support Identified and addressed patients feelings and concerns about diabetes;Worked with patient to identify barriers to care and solutions    Preconception care Reviewed with patient blood glucose goals with pregnancy;Role of family planning for patients with diabetes      Individualized Goals (developed by patient)   Nutrition General guidelines for healthy choices and portions discussed    Physical Activity Exercise 5-7 days per week;30 minutes per day    Medications take my medication as prescribed    Monitoring  Test my blood glucose as discussed    Problem Solving Eating Pattern;Addressing barriers to behavior change    Reducing Risk examine blood  glucose patterns;do foot checks daily;Not Applicable      Patient Self-Evaluation of Goals - Patient rates self as meeting previously set goals (% of time)   Nutrition 50 - 75 % (half of the time)    Physical Activity < 25% (hardly ever/never)    Medications < 25% (hardly ever/never)    Monitoring < 25% (hardly ever/never)    Problem Solving and behavior change strategies  25 - 50% (sometimes)    Reducing Risk (treating acute and chronic complications) 25 - 50% (sometimes)    Health Coping 50 - 75 % (half of the time)      Post-Education Assessment   Patient understands the diabetes disease and treatment process. Comprehends key points    Patient understands incorporating nutritional management into lifestyle. Comprehends key points    Patient undertands incorporating physical activity into lifestyle. Comprehends  key points    Patient understands using medications safely. Comphrehends key points    Patient understands monitoring blood glucose, interpreting and using results Comprehends key points    Patient understands prevention, detection, and treatment of acute complications. Comprehends key points    Patient understands prevention, detection, and treatment of chronic complications. Comprehends key points    Patient understands how to develop strategies to address psychosocial issues. Needs Review    Patient understands how to develop strategies to promote health/change behavior. Needs Review      Outcomes   Expected Outcomes Demonstrated interest in learning. Expect positive outcomes    Future DMSE PRN    Program Status Completed      Subsequent Visit   Since your last visit have you continued or begun to take your medications as prescribed? No             Individualized Plan for Diabetes Self-Management Training:   Learning Objective:  Patient will have a greater understanding of diabetes self-management. Patient education plan is to attend individual and/or group sessions  per assessed needs and concerns.   Plan:   Patient Instructions  Include protein with each meal and snack Think of nutrition quality when eating a snack or meal Consider simple meal ideas and meal planning prior to shopping Choose low fat meals most frequently Aim for 1/2 your plate non-starchy vegetables Aim for a 30 minute walk most days   Expected Outcomes:  Demonstrated interest in learning. Expect positive outcomes  Education material provided: ADA - How to Thrive: A Guide for Your Journey with Diabetes, Meal plan card, and Diabetes Resources, Inositol (benefits for PCOS)  If problems or questions, patient to contact team via:  Phone  Future DSME appointment: PRN

## 2023-02-09 ENCOUNTER — Telehealth: Payer: Self-pay | Admitting: Clinical

## 2023-02-09 NOTE — Telephone Encounter (Signed)
Attempt call regarding referral; Left HIPPA-compliant message to call back Mikle Sternberg from [facility] at  [phone] (Ashelyn Mccravy's office).    

## 2023-04-05 ENCOUNTER — Encounter (HOSPITAL_BASED_OUTPATIENT_CLINIC_OR_DEPARTMENT_OTHER): Payer: Self-pay | Admitting: Obstetrics & Gynecology

## 2023-04-10 ENCOUNTER — Telehealth (HOSPITAL_BASED_OUTPATIENT_CLINIC_OR_DEPARTMENT_OTHER): Admitting: Certified Nurse Midwife

## 2023-04-10 ENCOUNTER — Telehealth (HOSPITAL_BASED_OUTPATIENT_CLINIC_OR_DEPARTMENT_OTHER): Payer: Self-pay | Admitting: Certified Nurse Midwife

## 2023-04-10 DIAGNOSIS — Z30011 Encounter for initial prescription of contraceptive pills: Secondary | ICD-10-CM

## 2023-04-10 MED ORDER — NORGESTIMATE-ETH ESTRADIOL 0.25-35 MG-MCG PO TABS: 1.0000 | ORAL_TABLET | Freq: Every day | ORAL | 4 refills | Status: DC

## 2023-04-10 NOTE — Telephone Encounter (Signed)
 Patient was on Progesterone Only pill due to breastfeeding but she has weaned. She would like to restart Combination Oral Contraceptive. She denies hypertension. Took COC in the past and did well on it. Periods are not irregular (PCOS) and she would prefer to go ahead and start the COC pill now. Rx sent. Pt aware that she is due for annual gyn exam and follow-up pap smear. She will call to schedule.  Her baby now has a diagnosis of Failure to Thrive and has a feeding tube. They are having multiple appointments and traveling to [facility].  She is aware that if she is experiencing postpartum depression or anxiety or situational depression  or anxiety she can call and we can discuss therapy and/or medications.  Letta Kocher

## 2023-05-05 ENCOUNTER — Other Ambulatory Visit

## 2023-05-08 ENCOUNTER — Ambulatory Visit: Admitting: "Endocrinology

## 2023-05-08 ENCOUNTER — Encounter: Payer: Self-pay | Admitting: "Endocrinology

## 2023-05-08 VITALS — BP 124/80 | HR 65 | Ht 65.0 in | Wt 238.0 lb

## 2023-05-08 DIAGNOSIS — Z8639 Personal history of other endocrine, nutritional and metabolic disease: Secondary | ICD-10-CM | POA: Diagnosis not present

## 2023-05-08 LAB — POCT GLYCOSYLATED HEMOGLOBIN (HGB A1C): Hemoglobin A1C: 5.6 % (ref 4.0–5.6)

## 2023-05-08 MED ORDER — BLOOD GLUCOSE MONITORING SUPPL DEVI: 1.0000 | Freq: Three times a day (TID) | 0 refills | Status: DC

## 2023-05-08 MED ORDER — LANCETS MISC. MISC: 1.0000 | Freq: Three times a day (TID) | 3 refills | Status: AC

## 2023-05-08 MED ORDER — BLOOD GLUCOSE TEST VI STRP: 1.0000 | ORAL_STRIP | Freq: Three times a day (TID) | 3 refills | Status: DC

## 2023-05-08 MED ORDER — LANCET DEVICE MISC: 1.0000 | Freq: Three times a day (TID) | 0 refills | Status: AC

## 2023-05-08 NOTE — Patient Instructions (Signed)

## 2023-05-08 NOTE — Progress Notes (Signed)
 Outpatient Endocrinology Note Pamela Newcomer, MD  05/08/23   Allisha Simonelli [place]urf March 18, 2000 [id number]  Referring Provider: Lillian Rein, MD Primary Care Provider: Lillian Rein, MD Reason for consultation: Subjective   Assessment & Plan  Diagnoses and all orders for this visit:  History of diabetes mellitus -     Blood Glucose Monitoring Suppl DEVI; 1 each by Does not apply route in the morning, at noon, and at bedtime. May substitute to any manufacturer covered by patient's insurance. -     Glucose Blood (BLOOD GLUCOSE TEST STRIPS) STRP; 1 each by In Vitro route in the morning, at noon, and at bedtime. May substitute to any manufacturer covered by patient's insurance. -     Lancet Device MISC; 1 each by Does not apply route in the morning, at noon, and at bedtime. May substitute to any manufacturer covered by patient's insurance. -     Lancets Misc. MISC; 1 each by Does not apply route in the morning, at noon, and at bedtime. May substitute to any manufacturer covered by patient's insurance. -     POCT glycosylated hemoglobin (Hb A1C)    Diabetes Type II complicated by hyperglycemia, No results found for: "GFR" Hba1c goal less than 7, current Hba1c is  Lab Results  Component Value Date   HGBA1C 5.6 05/08/2023   Will recommend the following: History of diabetes "on and off" per patient  Current A1C WNL Continue diet and activity  A1C Q3 mo with PCP   Tried both metformin  regular and XR in the past and had stomach issues Tried mounjaro and had help with it  No history of MEN syndrome/medullary thyroid  cancer/pancreatitis or pancreatic cancer in self or family  No known contraindications/side effects to any of above medications  -Last LD and Tg are as follows: Lab Results  Component Value Date   LDLCALC 129 (H) 04/22/2022    Lab Results  Component Value Date   TRIG 105 04/22/2022   -not on statin  -Follow low fat diet and exercise   -Blood pressure goal  <140/90 - Microalbumin/creatinine goal is < 30 -Last MA/Cr is as follows: No results found for: "MICROALBUR", "MALB24HUR" -not on ACE/ARB  -diet changes including salt restriction -limit eating outside -counseled BP targets per standards of diabetes care -uncontrolled blood pressure can lead to retinopathy, nephropathy and cardiovascular and atherosclerotic heart disease  Reviewed and counseled on: -A1C target -Blood sugar targets -Complications of uncontrolled diabetes  -Checking blood sugar before meals and bedtime and bring log next visit -All medications with mechanism of action and side effects -Hypoglycemia management: rule of 15's, Glucagon Emergency Kit and medical alert ID -low-carb low-fat plate-method diet -At least 20 minutes of physical activity per day -Annual dilated retinal eye exam and foot exam -compliance and follow up needs -follow up as scheduled or earlier if problem gets worse  Call if blood sugar is less than 70 or consistently above 250    Take a 15 gm snack of carbohydrate at bedtime before you go to sleep if your blood sugar is less than 100.    If you are going to fast after midnight for a test or procedure, ask your physician for instructions on how to reduce/decrease your insulin  dose.    Call if blood sugar is less than 70 or consistently above 250  -Treating a low sugar by rule of 15  (15 gms of sugar every 15 min until sugar is more than 70) If you feel  your sugar is low, test your sugar to be sure If your sugar is low (less than 70), then take 15 grams of a fast acting Carbohydrate (3-4 glucose tablets or glucose gel or 4 ounces of juice or regular soda) Recheck your sugar 15 min after treating low to make sure it is more than 70 If sugar is still less than 70, treat again with 15 grams of carbohydrate          Don't drive the hour of hypoglycemia  If unconscious/unable to eat or drink by mouth, use glucagon injection or nasal spray baqsimi and  call 911. Can repeat again in 15 min if still unconscious.  Return in about 1 month (around 06/08/2023).   I have reviewed current medications, nurse's notes, allergies, vital signs, past medical and surgical history, family medical history, and social history for this encounter. Counseled patient on symptoms, examination findings, lab findings, imaging results, treatment decisions and monitoring and prognosis. The patient understood the recommendations and agrees with the treatment plan. All questions regarding treatment plan were fully answered.  Pamela Newcomer, MD  05/08/23    History of Present Illness Pamela HOVSEPIAN is a 23 y.o. year old female who presents for evaluation of Type II diabetes mellitus.  [name] was first diagnosed around 1. Diabetes education +  Home diabetes regimen: none  COMPLICATIONS -  MI/Stroke -  retinopathy +  neuropathy -  nephropathy  SYMPTOMS REVIEWED - Polyuria - Weight loss - Blurred vision  BLOOD SUGAR DATA Did not bring meter  Physical Exam  BP 124/80   Pulse 65   Ht 5\' 5"  (1.651 m)   Wt 238 lb (108 kg)   SpO2 98%   BMI 39.61 kg/m    Constitutional: well developed, well nourished Head: normocephalic, atraumatic Eyes: sclera anicteric, no redness Neck: supple Lungs: normal respiratory effort Neurology: alert and oriented Skin: dry, no appreciable rashes Musculoskeletal: no appreciable defects Psychiatric: normal mood and affect Diabetic Foot Exam - Simple   Simple Foot Form Diabetic Foot exam was performed with the following findings: Yes 05/08/2023 10:36 AM  Visual Inspection No deformities, no ulcerations, no other skin breakdown bilaterally: Yes Sensation Testing Intact to touch and monofilament testing bilaterally: Yes Pulse Check Posterior Tibialis and Dorsalis pulse intact bilaterally: Yes Comments      Current Medications Patient's Medications  New Prescriptions   BLOOD GLUCOSE MONITORING SUPPL DEVI     1 each by Does not apply route in the morning, at noon, and at bedtime. May substitute to any manufacturer covered by patient's insurance.   GLUCOSE BLOOD (BLOOD GLUCOSE TEST STRIPS) STRP    1 each by In Vitro route in the morning, at noon, and at bedtime. May substitute to any manufacturer covered by patient's insurance.   LANCET DEVICE MISC    1 each by Does not apply route in the morning, at noon, and at bedtime. May substitute to any manufacturer covered by patient's insurance.   LANCETS MISC. MISC    1 each by Does not apply route in the morning, at noon, and at bedtime. May substitute to any manufacturer covered by patient's insurance.  Previous Medications   CONTINUOUS GLUCOSE SENSOR (DEXCOM G7 SENSOR) MISC    Please dispense 30 day supply of dexcom 7 sensors.  3 sensors.   DROSPIRENONE  (SLYND ) 4 MG TABS    Take 1 tablet (4 mg total) by mouth daily.   FERROUS SULFATE  325 (65 FE) MG TABLET  Take 1 tablet (325 mg total) by mouth every other day.   METOPROLOL  SUCCINATE (TOPROL -XL) 50 MG 24 HR TABLET    Take 1 tablet (50 mg total) by mouth daily.   NORGESTIMATE -ETHINYL ESTRADIOL  (ORTHO-CYCLEN) 0.25-35 MG-MCG TABLET    Take 1 tablet by mouth daily.   PRENATAL VIT-FE FUMARATE-FA (PRENATAL MULTIVITAMIN) TABS TABLET    Take 1 tablet by mouth daily at 12 noon.  Modified Medications   No medications on file  Discontinued Medications   No medications on file    Allergies No Known Allergies  Past Medical History Past Medical History:  Diagnosis Date   Acquired acanthosis nigricans    Anxiety    Bipolar affective (HCC)    with depression and anxiety.   Chronic headaches 09/10/2013   Constipation 11/13/2014   Deliberate self-cutting    last done 1 month ago to left arm   Depression    Diabetes mellitus without complication (HCC)    Type II   Dyspepsia    Episodic mood disorder (HCC) 07/12/2013   Goiter    Hidradenitis 07/24/2022   Uses topical clindamycin  solution  Needed abx  (keflex ) for furuncle on 7/19     Menorrhagia    required transfusion in 04/2013   Morbid obesity (HCC) 04/30/2013   Non-alcoholic fatty liver disease 11/13/2013   Obesity    Polycystic ovary disease    Reflux    Tachycardia    Transfusion history    Vitamin D  deficiency 11/13/2013    Past Surgical History Past Surgical History:  Procedure Laterality Date   CESAREAN SECTION N/A 12/14/2022   Procedure: CESAREAN SECTION;  Surgeon: Julianne Octave, MD;  Location: [facility];  Service: Obstetrics;  Laterality: N/A;   TONSILLECTOMY Bilateral 2018   TYMPANOSTOMY TUBE PLACEMENT      Family History family history includes Bipolar disorder in her father; Cancer in her paternal aunt; Cancer (age of onset: 14) in her maternal grandmother; Diabetes in her father, maternal aunt, maternal grandfather, maternal uncle, and mother; Heart attack in her father; Heart disease in her maternal grandfather and maternal uncle; Heart disease (age of onset: 43) in her father; Hypertension in her maternal grandfather and maternal grandmother; Schizophrenia in her paternal grandmother.  Social History Social History   Socioeconomic History   Marital status: Married    Spouse name: Not on file   Number of children: 0   Years of education: Not on file   Highest education level: Not on file  Occupational History   Not on file  Tobacco Use   Smoking status: Never   Smokeless tobacco: Never  Vaping Use   Vaping status: Former   Quit date: 04/09/2022  Substance and Sexual Activity   Alcohol use: No    Alcohol/week: 0.0 standard drinks of alcohol   Drug use: No   Sexual activity: Not Currently    Partners: Male  Other Topics Concern   Not on file  Social History Narrative   Family history includes mother with a hysterectomy due to bleeding after birth of second child.   Social Drivers of Health   Financial Resource Strain: Medium Risk (06/15/2022)   Overall Financial Resource Strain (CARDIA)     Difficulty of Paying Living Expenses: Somewhat hard  Food Insecurity: No Food Insecurity (12/13/2022)   Hunger Vital Sign    Worried About Running Out of Food in the Last Year: Never true    Ran Out of Food in the Last Year: Never true  Transportation Needs: No Transportation Needs (12/13/2022)   PRAPARE - Administrator, Civil Service (Medical): No    Lack of Transportation (Non-Medical): No  Physical Activity: Inactive (06/15/2022)   Exercise Vital Sign    Days of Exercise per Week: 0 days    Minutes of Exercise per Session: 0 min  Stress: No Stress Concern Present (06/15/2022)   [employer] of Occupational Health - Occupational Stress Questionnaire    Feeling of Stress : Only a little  Social Connections: Moderately Isolated (06/15/2022)   Social Connection and Isolation Panel [NHANES]    Frequency of Communication with Friends and Family: More than three times a week    Frequency of Social Gatherings with Friends and Family: Once a week    Attends Religious Services: Never    Database administrator or Organizations: No    Attends Banker Meetings: Never    Marital Status: Married  Catering manager Violence: Unknown (12/16/2022)   Humiliation, Afraid, Rape, and Kick questionnaire    Fear of Current or Ex-Partner: Patient declined    Emotionally Abused: No    Physically Abused: No    Sexually Abused: No    Lab Results  Component Value Date   HGBA1C 5.6 05/08/2023   HGBA1C 5.3 04/22/2022   HGBA1C 6.5 04/28/2016   Lab Results  Component Value Date   CHOL 191 04/22/2022   Lab Results  Component Value Date   HDL 43 04/22/2022   Lab Results  Component Value Date   LDLCALC 129 (H) 04/22/2022   Lab Results  Component Value Date   TRIG 105 04/22/2022   Lab Results  Component Value Date   CHOLHDL 4.4 04/22/2022   Lab Results  Component Value Date   CREATININE 0.63 12/15/2022   No results found for: "GFR" No results found for:  "MICROALBUR", "MALB24HUR"    Component Value Date/Time   NA 136 12/15/2022 0606   NA 140 10/31/2022 1612   K 3.7 12/15/2022 0606   CL 107 12/15/2022 0606   CO2 23 12/15/2022 0606   GLUCOSE 97 12/15/2022 0606   BUN <5 (L) 12/15/2022 0606   BUN 10 10/31/2022 1612   CREATININE 0.63 12/15/2022 0606   CREATININE 0.60 03/17/2014 1036   CALCIUM  8.6 (L) 12/15/2022 0606   PROT 5.6 (L) 12/15/2022 0606   PROT 6.8 10/31/2022 1612   ALBUMIN 2.1 (L) 12/15/2022 0606   ALBUMIN 3.7 (L) 10/31/2022 1612   AST 18 12/15/2022 0606   ALT 20 12/15/2022 0606   ALKPHOS 72 12/15/2022 0606   BILITOT 0.7 12/15/2022 0606   BILITOT <0.2 10/31/2022 1612   GFRNONAA >60 12/15/2022 0606   GFRNONAA >89 11/03/2013 1435   GFRAA 123 06/06/2016 1742   GFRAA >89 11/03/2013 1435      Latest Ref Rng & Units 12/15/2022    6:06 AM 12/10/2022    7:50 PM 10/31/2022    4:12 PM  BMP  Glucose 70 - 99 mg/dL 97  97  191   BUN 6 - 20 mg/dL 5  8  10    Creatinine 0.44 - 1.00 mg/dL 4.78  2.95  6.21   BUN/Creat Ratio 9 - 23   18   Sodium 135 - 145 mmol/L 136  136  140   Potassium 3.5 - 5.1 mmol/L 3.7  3.6  3.9   Chloride 98 - 111 mmol/L 107  105  103   CO2 22 - 32 mmol/L 23  22  CANCELED  Calcium  8.9 - 10.3 mg/dL 8.6  9.2  9.3        Component Value Date/Time   WBC 15.1 (H) 12/15/2022 0606   RBC 3.39 (L) 12/15/2022 0606   HGB 10.3 (L) 12/15/2022 0606   HGB 11.3 10/31/2022 1612   HCT 31.0 (L) 12/15/2022 0606   HCT 34.4 10/31/2022 1612   PLT 236 12/15/2022 0606   PLT 265 10/31/2022 1612   MCV 91.4 12/15/2022 0606   MCV 93 10/31/2022 1612   MCH 30.4 12/15/2022 0606   MCHC 33.2 12/15/2022 0606   RDW 15.9 (H) 12/15/2022 0606   RDW 13.6 10/31/2022 1612   LYMPHSABS 2.3 12/10/2022 1950   MONOABS 0.7 12/10/2022 1950   EOSABS 0.2 12/10/2022 1950   BASOSABS 0.0 12/10/2022 1950     Parts of this note may have been dictated using voice recognition software. There may be variances in spelling and vocabulary which are  unintentional. Not all errors are proofread. Please notify the [facility] if any discrepancies are noted or if the meaning of any statement is not clear.

## 2023-06-13 ENCOUNTER — Ambulatory Visit: Admitting: "Endocrinology

## 2023-08-07 ENCOUNTER — Ambulatory Visit: Admitting: Student in an Organized Health Care Education/Training Program

## 2023-08-07 ENCOUNTER — Encounter: Payer: Self-pay | Admitting: Student in an Organized Health Care Education/Training Program

## 2023-08-07 VITALS — BP 124/59 | HR 89 | Ht 66.75 in | Wt 249.4 lb

## 2023-08-07 DIAGNOSIS — I471 Supraventricular tachycardia, unspecified: Secondary | ICD-10-CM

## 2023-08-07 DIAGNOSIS — E66812 Obesity, class 2: Secondary | ICD-10-CM

## 2023-08-07 DIAGNOSIS — E1165 Type 2 diabetes mellitus with hyperglycemia: Secondary | ICD-10-CM | POA: Diagnosis not present

## 2023-08-07 DIAGNOSIS — F39 Unspecified mood [affective] disorder: Secondary | ICD-10-CM | POA: Diagnosis not present

## 2023-08-07 DIAGNOSIS — Z794 Long term (current) use of insulin: Secondary | ICD-10-CM

## 2023-08-07 MED ORDER — METOPROLOL SUCCINATE ER 50 MG PO TB24: 50.0000 mg | ORAL_TABLET | Freq: Every day | ORAL | 3 refills | Status: AC

## 2023-08-07 NOTE — Assessment & Plan Note (Signed)
 Interesting history of hard to control gestational diabetes.  She reports at 1 point being on over 200 units of insulin  per day.  I wonder if she had some type of anti-insulin  auto antibody.  Since delivery 8 months ago, she has not required any further insulin .  Her last A1c was 5.6% 3 months ago.  She is currently not using any diabetes medications.  Weight remains an issue.  I think she is at risk for diabetes in the future.  Would classify her at this point as prediabetes.  I recommended we check A1c every 6-12 months.  I think weight loss is the best way to avoid diabetes in the future.

## 2023-08-07 NOTE — Progress Notes (Signed)
 New Patient Office Visit  Subjective    Patient ID: [name], female    DOB: December 16, 2000  Age: 23 y.o. MRN: [id number]  CC:  Chief Complaint  Patient presents with   Establish Care     Refill on blood pressure medication.     HPI  Discussed the use of AI scribe software for clinical note transcription with the patient, who gave verbal consent to proceed.  History of Present Illness [name] is a 23 year old female with supraventricular tachycardia who presents for a prescription refill of metoprolol .  She has been managing episodes of supraventricular tachycardia with metoprolol  for the past two and a half years. Previously, her heart rate would spike to 150 beats per minute while sitting. She is currently taking 50 mg of metoprolol  succinate once daily and has not experienced side effects such as lightheadedness on standing. Her OB had been refilling the prescription, but she is now seeking a refill due to a lapse in her primary care provider's system.  She has a history of diabetes, which was managed with Mounjaro prior to her pregnancy. During her pregnancy, she required up to 200 units of insulin  daily. Postpartum, her A1c was recorded at 5.6% without medication, although she has noticed some spikes in her blood sugar. She continues to monitor her blood sugar at home despite not currently being on diabetes medication.  She delivered her first child in December, and her daughter had a challenging start, including a NICU stay and a subsequent hospitalization for feeding issues. Her daughter had a feeding tube until May and is currently undergoing speech therapy to assist with eating. 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  She feels tired and acknowledges mood fluctuations, which she attributes to hormonal changes postpartum. She states she hasn't had time to be depressed. She has  a history of being diagnosed with bipolar disorder as a teenager, but she has not been on mood stabilizers for about ten years. A psychiatrist has suggested she may have complex PTSD rather than bipolar disorder.  She has a history of PCOS and is currently using oral birth control. No significant issues with her vision or hearing, although she notes occasional ear pain and the need for a new glasses prescription. She has a history of ear tubes and a heart murmur.   Outpatient Encounter Medications as of 08/07/2023  Medication Sig   norgestimate -ethinyl estradiol  (ORTHO-CYCLEN) 0.25-35 MG-MCG tablet Take 1 tablet by mouth daily.   [DISCONTINUED] Accu-Chek Softclix Lancets lancets SMARTSIG:Topical   [DISCONTINUED] Blood Glucose Monitoring Suppl DEVI 1 each by Does not apply route in the morning, at noon, and at bedtime. May substitute to any manufacturer covered by patient's insurance.   [DISCONTINUED] Glucose Blood (BLOOD GLUCOSE TEST STRIPS) STRP 1 each by In Vitro route in the morning, at noon, and at bedtime. May substitute to any manufacturer covered by patient's insurance.   [DISCONTINUED] metoprolol  succinate (TOPROL -XL) 50 MG 24 hr tablet Take 1 tablet (50 mg total) by mouth daily.   metoprolol  succinate (TOPROL -XL) 50 MG 24 hr tablet Take 1 tablet (50 mg total) by mouth daily.   [DISCONTINUED] Continuous Glucose Sensor (DEXCOM G7 SENSOR) MISC Please dispense 30 day supply of dexcom 7 sensors.  3 sensors. (Patient not taking: Reported on 08/07/2023)   [DISCONTINUED] Drospirenone  (SLYND ) 4 MG TABS Take 1 tablet (4 mg total) by mouth daily. (Patient not taking:  Reported on 08/07/2023)   [DISCONTINUED] ferrous sulfate  325 (65 FE) MG tablet Take 1 tablet (325 mg total) by mouth every other day. (Patient not taking: Reported on 08/07/2023)   [DISCONTINUED] Prenatal Vit-Fe Fumarate-FA (PRENATAL MULTIVITAMIN) TABS tablet Take 1 tablet by mouth daily at 12 noon. (Patient not taking: Reported on 08/07/2023)    No facility-administered encounter medications on file as of 08/07/2023.    Past Medical History:  Diagnosis Date   Acquired acanthosis nigricans    Anxiety    Bipolar affective (HCC)    with depression and anxiety.   Chronic headaches 09/10/2013   Constipation 11/13/2014   Deliberate self-cutting    last done 1 month ago to left arm   Depression    Diabetes mellitus without complication (HCC)    Type II   Dyspepsia    Episodic mood disorder (HCC) 07/12/2013   Goiter    Hidradenitis 07/24/2022   Uses topical clindamycin  solution  Needed abx (keflex ) for furuncle on 7/19     Menorrhagia    required transfusion in 04/2013   Morbid obesity (HCC) 04/30/2013   Non-alcoholic fatty liver disease 11/13/2013   Obesity    Polycystic ovary disease    Reflux    Tachycardia    Transfusion history    Vitamin D  deficiency 11/13/2013    Past Surgical History:  Procedure Laterality Date   CESAREAN SECTION N/A 12/14/2022   Procedure: CESAREAN SECTION;  Surgeon: Herchel Gloris LABOR, MD;  Location: [facility];  Service: Obstetrics;  Laterality: N/A;   TONSILLECTOMY Bilateral 2018   TYMPANOSTOMY TUBE PLACEMENT      Family History  Problem Relation Age of Onset   Diabetes Mother    Diabetes Father    Bipolar disorder Father    Heart disease Father 71       AMI   Heart attack Father    Diabetes Maternal Aunt    Diabetes Maternal Uncle    Heart disease Maternal Uncle    Cancer Paternal Aunt        lung metastatic to breat and liver   Cancer Maternal Grandmother 68       lung cancer   Hypertension Maternal Grandmother    Hypertension Maternal Grandfather    Diabetes Maternal Grandfather    Heart disease Maternal Grandfather    Schizophrenia Paternal Grandmother        Objective    BP (!) 124/59 (BP Location: Right Arm, Patient Position: Sitting, Cuff Size: Large)   Pulse 89   Ht 5' 6.75 (1.695 m)   Wt 249 lb 6.4 oz (113.1 kg)   SpO2 98%   BMI 39.35 kg/m   Physical  Exam  Gen: Well-appearing woman Ears: Bilateral scars on the tympanic membranes from prior tubes Mouth: Normal Neck: Increased circumference, moderately enlarged thyroid  diffusely, no nodules or adenopathy Heart: Regular, 2 out of 6 early systolic murmur best heard at the left upper sternal border Lungs: Distant sounds, unlabored, clear Ext: Warm, no edema, normal joints Psych: Mildly depressed appearing affect, normal speech, organized thoughts, nonanxious appearing Neuro: Alert, conversational, full strength upper and lower extremities     Assessment & Plan:   Problem List Items Addressed This Visit       High   Obesity, Class II, 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  Weight today 249 pounds with a BMI of 39.  Did well on Mounjaro when she had diabetes.  Currently in the prediabetes range.  At risk for metabolic syndrome.  I recommended lifestyle modifications including nutrition and increased exercise.  Will monitor weight every 6-12 months, and monitor for metabolic syndrome with labs at least once a year.      Mood disorder (HCC) (Chronic)   Chronic intermittent issue.  Significant mood issues about 10 years ago, 1 hospitalization.  Some history of self-harm.  Has not been on a mood stabilizer for about 10 years.  No current symptoms to suggest bipolar disease.  She has some postpartum adjustment disorder, but does not look to have postpartum depression today.  We talked about medication assisted treatment options in the future.  She does not think she needs medication treatment for her mood issues right now, but I offered it in the future for supportive care if needed.      Type 2 diabetes mellitus with hyperglycemia (HCC) - Primary (Chronic)   Interesting history of hard to control gestational diabetes.  She reports at 1 point being on over 200 units of insulin  per day.  I wonder if she had some type of anti-insulin  auto antibody.  Since delivery 8 months ago, she has not  required any further insulin .  Her last A1c was 5.6% 3 months ago.  She is currently not using any diabetes medications.  Weight remains an issue.  I think she is at risk for diabetes in the future.  Would classify her at this point as prediabetes.  I recommended we check A1c every 6-12 months.  I think weight loss is the best way to avoid diabetes in the future.      Paroxysmal supraventricular tachycardia (HCC) (Chronic)   History of paroxysmal episodes of sinus tachycardia.  She has had a consultation with Dr. Sheena in cardiology.  She has had ambulatory monitors.  Has been doing well for couple years on metoprolol  succinate to suppress the paroxysmal episodes of sinus tachycardia.  Will continue this medication.  I do not think it will be indefinite, can work in the coming years to trial periods off the medication as tolerated.  For now we will refill the metoprolol  succinate 50 mg to be taken once daily.      Relevant Medications   metoprolol  succinate (TOPROL -XL) 50 MG 24 hr tablet    Return in about 3 months (around 11/07/2023) for Diabetes management.   Cleatus Debby Specking, MD

## 2023-08-07 NOTE — Assessment & Plan Note (Signed)
 Chronic intermittent issue.  Significant mood issues about 10 years ago, 1 hospitalization.  Some history of self-harm.  Has not been on a mood stabilizer for about 10 years.  No current symptoms to suggest bipolar disease.  She has some postpartum adjustment disorder, but does not look to have postpartum depression today.  We talked about medication assisted treatment options in the future.  She does not think she needs medication treatment for her mood issues right now, but I offered it in the future for supportive care if needed.

## 2023-08-07 NOTE — Assessment & Plan Note (Signed)
 History of paroxysmal episodes of sinus tachycardia.  She has had a consultation with Dr. Sheena in cardiology.  She has had ambulatory monitors.  Has been doing well for couple years on metoprolol  succinate to suppress the paroxysmal episodes of sinus tachycardia.  Will continue this medication.  I do not think it will be indefinite, can work in the coming years to trial periods off the medication as tolerated.  For now we will refill the metoprolol  succinate 50 mg to be taken once daily.

## 2023-08-07 NOTE — Assessment & Plan Note (Signed)
 Chronic issue.  Weight today 249 pounds with a BMI of 39.  Did well on Mounjaro when she had diabetes.  Currently in the prediabetes range.  At risk for metabolic syndrome.  I recommended lifestyle modifications including nutrition and increased exercise.  Will monitor weight every 6-12 months, and monitor for metabolic syndrome with labs at least once a year.

## 2023-08-07 NOTE — Patient Instructions (Signed)
  VISIT SUMMARY: Today, we discussed your ongoing management of supraventricular tachycardia, diabetes, PCOS, and postpartum status. We refilled your prescription for metoprolol  and reviewed your current health status, including your blood sugar levels and mood.  YOUR PLAN: -PAROXYSMAL SUPRAVENTRICULAR TACHYCARDIA: This condition involves episodes where your heart suddenly beats much faster than normal. You are managing it well with metoprolol  succinate 50 mg daily, and you have not experienced any recent episodes or side effects. We have refilled your prescription and will consider re-evaluating the necessity of this medication in a few years when your stress levels decrease.  -TYPE 2 DIABETES MELLITUS, CONTROLLED: This is a condition where your blood sugar levels are higher than normal. Your A1c is currently well controlled at 5.6% without medication. We will check your A1c every 6 to 12 months and follow up in 3 months to reassess your A1c, weight, and mood.  -POLYCYSTIC OVARY SYNDROME (PCOS): This is a hormonal disorder causing enlarged ovaries with small cysts. You are currently using oral contraceptives for birth control.  -POSTPARTUM STATUS: You are about 7 months post-delivery and experiencing fatigue and mood fluctuations, which are common during this period. There are no current depressive symptoms, but we will monitor your mood. If your mood starts to impair your work or family life, we may consider starting an SSRI medication.  INSTRUCTIONS: Please follow up in 3 months to reassess your A1c, weight, and mood. Continue monitoring your blood sugar levels at home and report any significant changes. If you experience any new symptoms or have concerns, please contact our office.

## 2023-08-09 ENCOUNTER — Ambulatory Visit: Admitting: "Endocrinology

## 2023-08-10 ENCOUNTER — Encounter (HOSPITAL_BASED_OUTPATIENT_CLINIC_OR_DEPARTMENT_OTHER): Payer: Self-pay | Admitting: Obstetrics & Gynecology

## 2023-10-03 ENCOUNTER — Other Ambulatory Visit: Payer: Self-pay | Admitting: "Endocrinology

## 2023-10-03 DIAGNOSIS — Z8639 Personal history of other endocrine, nutritional and metabolic disease: Secondary | ICD-10-CM

## 2023-10-04 ENCOUNTER — Other Ambulatory Visit: Payer: Self-pay

## 2023-10-04 ENCOUNTER — Encounter (HOSPITAL_BASED_OUTPATIENT_CLINIC_OR_DEPARTMENT_OTHER): Payer: Self-pay | Admitting: Emergency Medicine

## 2023-10-04 ENCOUNTER — Ambulatory Visit: Payer: Self-pay

## 2023-10-04 ENCOUNTER — Emergency Department (HOSPITAL_BASED_OUTPATIENT_CLINIC_OR_DEPARTMENT_OTHER)
Admission: EM | Admit: 2023-10-04 | Discharge: 2023-10-04 | Disposition: A | Discharge status: Home / Self Care | Attending: Emergency Medicine | Admitting: Emergency Medicine

## 2023-10-04 DIAGNOSIS — E1165 Type 2 diabetes mellitus with hyperglycemia: Secondary | ICD-10-CM | POA: Insufficient documentation

## 2023-10-04 DIAGNOSIS — R739 Hyperglycemia, unspecified: Secondary | ICD-10-CM | POA: Diagnosis present

## 2023-10-04 LAB — URINALYSIS, ROUTINE W REFLEX MICROSCOPIC
Bacteria, UA: NONE SEEN
Bilirubin Urine: NEGATIVE
Glucose, UA: >1000 mg/dL — AB
Ketones, ur: NEGATIVE mg/dL
Leukocytes,Ua: NEGATIVE
Nitrite: NEGATIVE
Protein, ur: NEGATIVE mg/dL
Specific Gravity, Urine: 1.041 — ABNORMAL HIGH (ref 1.005–1.030)
pH: 5 (ref 5.0–8.0)

## 2023-10-04 LAB — I-STAT VENOUS BLOOD GAS, ED
Acid-base deficit: 1 mmol/L (ref 0.0–2.0)
Bicarbonate: 23.1 mmol/L (ref 20.0–28.0)
Calcium, Ion: 1.2 mmol/L (ref 1.15–1.40)
HCT: 40 % (ref 36.0–46.0)
Hemoglobin: 13.6 g/dL (ref 12.0–15.0)
O2 Saturation: 83 %
Potassium: 3.9 mmol/L (ref 3.5–5.1)
Sodium: 133 mmol/L — ABNORMAL LOW (ref 135–145)
TCO2: 24 mmol/L (ref 22–32)
pCO2, Ven: 37.2 mmHg — ABNORMAL LOW (ref 44–60)
pH, Ven: 7.4 (ref 7.25–7.43)
pO2, Ven: 47 mmHg — ABNORMAL HIGH (ref 32–45)

## 2023-10-04 LAB — CBC
HCT: 40.7 % (ref 36.0–46.0)
Hemoglobin: 14.4 g/dL (ref 12.0–15.0)
MCH: 31.4 pg (ref 26.0–34.0)
MCHC: 35.4 g/dL (ref 30.0–36.0)
MCV: 88.9 fL (ref 80.0–100.0)
Platelets: 266 K/uL (ref 150–400)
RBC: 4.58 MIL/uL (ref 3.87–5.11)
RDW: 12.7 % (ref 11.5–15.5)
WBC: 8.8 K/uL (ref 4.0–10.5)
nRBC: 0 % (ref 0.0–0.2)

## 2023-10-04 LAB — CBG MONITORING, ED
Glucose-Capillary: 408 mg/dL — ABNORMAL HIGH (ref 70–99)
Glucose-Capillary: 314 mg/dL — ABNORMAL HIGH (ref 70–99)

## 2023-10-04 LAB — BASIC METABOLIC PANEL WITH GFR
Anion gap: 15 (ref 5–15)
BUN: 12 mg/dL (ref 6–20)
CO2: 20 mmol/L — ABNORMAL LOW (ref 22–32)
Calcium: 9.8 mg/dL (ref 8.9–10.3)
Chloride: 97 mmol/L — ABNORMAL LOW (ref 98–111)
Creatinine, Ser: 0.56 mg/dL (ref 0.44–1.00)
GFR, Estimated: >60 mL/min (ref >60)
Glucose, Bld: 393 mg/dL — ABNORMAL HIGH (ref 70–99)
Potassium: 3.8 mmol/L (ref 3.5–5.1)
Sodium: 132 mmol/L — ABNORMAL LOW (ref 135–145)

## 2023-10-04 LAB — PREGNANCY, URINE: Preg Test, Ur: NEGATIVE

## 2023-10-04 MED ORDER — SODIUM CHLORIDE 0.9 % IV BOLUS
1000.0000 mL | Freq: Once | INTRAVENOUS | Status: AC
  Administered 2023-10-04: 1000 mL via INTRAVENOUS

## 2023-10-04 NOTE — ED Provider Notes (Signed)
 [place] EMERGENCY DEPARTMENT AT [facility] Provider Note   CSN: [id number] Arrival date & time: 10/04/23  1548     Patient presents with: Hyperglycemia   [name] is a 23 y.o. female medical history seen for obesity, PCOS, hyperlipidemia, diabetes who reports that she had recently fairly well-controlled A1c and so is not currently taking any antihyperglycemic who presents with concern for headache, nausea.  She checked her blood sugar thinking that it might be low but was actually quite elevated at 330.  400 on arrival.  Was told to come to the ED by PCP.  She endorses some polyuria, polydipsia.    Hyperglycemia      Prior to Admission medications   Medication Sig Start Date End Date Taking? Authorizing Provider  metoprolol  succinate (TOPROL -XL) 50 MG 24 hr tablet Take 1 tablet (50 mg total) by mouth daily. 08/07/23   Jerrell Cleatus Ned, MD  norgestimate -ethinyl estradiol  (ORTHO-CYCLEN) 0.25-35 MG-MCG tablet Take 1 tablet by mouth daily. 04/10/23   Tad Arland POUR, CNM    Allergies: Patient has no known allergies.    Review of Systems  All other systems reviewed and are negative.   Updated Vital Signs BP 116/71 (BP Location: Right Arm)   Pulse 80   Temp 98 F (36.7 C)   Resp 18   Ht 5' 5 (1.651 m)   Wt 108.9 kg   LMP 10/04/2023 (Approximate)   SpO2 99%   Breastfeeding No   BMI 39.94 kg/m   Physical Exam Vitals and nursing note reviewed.  Constitutional:      General: She is not in acute distress.    Appearance: Normal appearance.  HENT:     Head: Normocephalic and atraumatic.  Eyes:     General:        Right eye: No discharge.        Left eye: No discharge.  Cardiovascular:     Rate and Rhythm: Normal rate and regular rhythm.     Heart sounds: No murmur heard.    No friction rub. No gallop.  Pulmonary:     Effort: Pulmonary effort is normal.     Breath sounds: Normal breath sounds.  Abdominal:     General: Bowel sounds are normal.      Palpations: Abdomen is soft.  Skin:    General: Skin is warm and dry.     Capillary Refill: Capillary refill takes less than 2 seconds.  Neurological:     Mental Status: She is alert and oriented to person, place, and time.  Psychiatric:        Mood and Affect: Mood normal.        Behavior: Behavior normal.     (all labs ordered are listed, but only abnormal results are displayed) Labs Reviewed  BASIC METABOLIC PANEL WITH GFR - Abnormal; Notable for the following components:      Result Value   Sodium 132 (*)    Chloride 97 (*)    CO2 20 (*)    Glucose, Bld 393 (*)    All other components within normal limits  URINALYSIS, ROUTINE W REFLEX MICROSCOPIC - Abnormal; Notable for the following components:   Color, Urine COLORLESS (*)    Specific Gravity, Urine 1.041 (*)    Glucose, UA >1,000 (*)    Hgb urine dipstick SMALL (*)    All other components within normal limits  CBG MONITORING, ED - Abnormal; Notable for the following components:   Glucose-Capillary 408 (*)  All other components within normal limits  CBG MONITORING, ED - Abnormal; Notable for the following components:   Glucose-Capillary 314 (*)    All other components within normal limits  I-STAT VENOUS BLOOD GAS, ED - Abnormal; Notable for the following components:   pCO2, Ven 37.2 (*)    pO2, Ven 47 (*)    Sodium 133 (*)    All other components within normal limits  CBC  PREGNANCY, URINE  BETA-HYDROXYBUTYRIC ACID  CBG MONITORING, ED    EKG: None  Radiology: No results found.   Procedures   Medications Ordered in the ED  sodium chloride  0.9 % bolus 1,000 mL (0 mLs Intravenous Stopped 10/04/23 1743)                                    Medical Decision Making Amount and/or Complexity of Data Reviewed Labs: ordered.   This patient is a 23 y.o. female  who presents to the ED for concern of hyperglycemia.   Differential diagnoses prior to evaluation: The emergent differential diagnosis includes,  but is not limited to,  DKA, HSS, vs simple hyperglycemia . This is not an exhaustive differential.   Past Medical History / Co-morbidities / Social History: Obesity, diabetes  Physical Exam: Physical exam performed. The pertinent findings include: Vital signs overall stable, normotensive, no tachycardia  Lab Tests/Imaging studies: I personally interpreted labs/imaging and the pertinent results include: CBC unremarkable, VBG with no acidosis, UA with greater than 1000 glucose, otherwise overall unremarkable, no ketones.  Negative serum pregnancy test, BMP shows pseudohyponatremia, sodium 132 in context of glucose 393, otherwise unremarkable, no anion gap, beta-hydroxybutyrate acid is a send out lab at this location, but given no anion gap, no acidosis think reasonable to treat hyperglycemia with fluids, discharged with metformin .   Medications: I ordered medication including fluid bolus for hyperglycemia.  I have reviewed the patients home medicines and have made adjustments as needed.   Disposition: After consideration of the diagnostic results and the patients response to treatment, I feel that patient with hyperglycemia but no DKA, she has previously diagnosed diabetes but is not currently taking medication, restarted on metformin  500 mg twice daily, encourage close PCP follow-up for glucose and A1c recheck.  Patient declined metformin  reporting that she has not tolerated it well in the past, she prefers to follow-up closely with her endocrinologist, I think this is reasonable.  emergency department workup does not suggest an emergent condition requiring admission or immediate intervention beyond what has been performed at this time. The plan is: as above. The patient is safe for discharge and has been instructed to return immediately for worsening symptoms, change in symptoms or any other concerns.   Final diagnoses:  Hyperglycemia due to diabetes mellitus [facility])    ED Discharge Orders      None          Rosan Sherlean DEL, PA-C 10/04/23 1846    Mannie Pac T, DO 10/09/23 [phone]

## 2023-10-04 NOTE — ED Notes (Signed)
 DC paperwork given and verbally understood.

## 2023-10-04 NOTE — ED Triage Notes (Signed)
 Pt via pov from home with hyperglycemia today. She had headache and nausea, thought she was hypoglycemic, got 332, got to over 400 within an hour. She called pcp and was told to come to ed. Pt a&o x4; nad noted.

## 2023-10-04 NOTE — ED Notes (Signed)
 Attempted IV x 1; pt will wait until roomed for iv

## 2023-10-04 NOTE — ED Notes (Signed)
 Urine obtained in Triage

## 2023-10-04 NOTE — ED Notes (Signed)
I-stat obtained by RRT

## 2023-10-04 NOTE — Discharge Instructions (Addendum)
 Please follow-up closely with your endocrinologist to discuss restarting diabetes medication.  Please return if you have severe worsening thirst, frequent urination, lightheadedness, headache, or glucose greater than 500.

## 2023-10-04 NOTE — Telephone Encounter (Signed)
 Patient was advised to go to ED for high blood sugar. FYI see notes

## 2023-10-04 NOTE — Telephone Encounter (Signed)
 FYI Only or Action Required?: FYI only for provider.  Patient was last seen in primary care on 08/07/2023 by Jerrell Cleatus Ned, MD.  Called Nurse Triage reporting Hyperglycemia.  Symptoms began today.  Interventions attempted: Nothing.  Symptoms are: gradually worsening.  Triage Disposition: Go to ED Now (or PCP Triage)  Patient/caregiver understands and will follow disposition?: Yes     Copied from CRM [id number]. Topic: Clinical - Red Word Triage >> Oct 04, 2023  1:32 PM Burnard DEL wrote: Red Word that prompted transfer to Nurse Triage: elevated blood sugar 332       Reason for Disposition  [1] Blood glucose > 240 mg/dL (86.6 mmol/L) AND [7] urine ketones moderate-large (or more than 1+)  Answer Assessment - Initial Assessment Questions 1. BLOOD GLUCOSE: What is your blood glucose level?      332, now 366 2. ONSET: When did you check the blood glucose?     This morning  3. USUAL RANGE: What is your glucose level usually? (e.g., usual fasting morning value, usual evening value)     80-160 4. KETONES: Do you check for ketones (urine or blood test strips)? If Yes, ask: What does the test show now?      Moderate ketones  5. TYPE 1 or 2:  Do you know what type of diabetes you have?  (e.g., Type 1, Type 2, Gestational; doesn't know)      Type 2 6. INSULIN : Do you take insulin ? What type of insulin (s) do you use? What is the mode of delivery? (syringe, pen; injection or pump)?      No 7. DIABETES PILLS: Do you take any pills for your diabetes? If Yes, ask: Have you missed taking any pills recently?     No 8. OTHER SYMPTOMS: Do you have any symptoms? (e.g., fever, frequent urination, difficulty breathing, dizziness, weakness, vomiting)     Headache, nausea  9. PREGNANCY: Is there any chance you are pregnant? When was your last menstrual period?     No  Protocols used: Diabetes - High Blood Sugar-A-AH

## 2023-10-05 ENCOUNTER — Ambulatory Visit: Admitting: Student in an Organized Health Care Education/Training Program

## 2023-10-05 ENCOUNTER — Encounter: Payer: Self-pay | Admitting: Student in an Organized Health Care Education/Training Program

## 2023-10-05 VITALS — BP 98/66 | HR 85 | Ht 65.0 in | Wt 243.4 lb

## 2023-10-05 DIAGNOSIS — I471 Supraventricular tachycardia, unspecified: Secondary | ICD-10-CM | POA: Diagnosis not present

## 2023-10-05 DIAGNOSIS — Z794 Long term (current) use of insulin: Secondary | ICD-10-CM

## 2023-10-05 DIAGNOSIS — E1165 Type 2 diabetes mellitus with hyperglycemia: Secondary | ICD-10-CM

## 2023-10-05 LAB — POCT GLYCOSYLATED HEMOGLOBIN (HGB A1C): Hemoglobin A1C: 11.3 % — AB (ref 4.0–5.6)

## 2023-10-05 MED ORDER — TRESIBA FLEXTOUCH 100 UNIT/ML ~~LOC~~ SOPN: 20.0000 [IU] | PEN_INJECTOR | Freq: Every day | SUBCUTANEOUS | 1 refills | Status: DC

## 2023-10-05 MED ORDER — DEXCOM G7 SENSOR MISC
1.0000 | 2 refills | Status: DC
Start: 2023-10-05 — End: 2023-10-19

## 2023-10-05 MED ORDER — TIRZEPATIDE 2.5 MG/0.5ML ~~LOC~~ SOAJ: 2.5000 mg | SUBCUTANEOUS | 1 refills | Status: DC

## 2023-10-05 MED ORDER — BD PEN NEEDLE MICRO ULTRAFINE 32G X 6 MM MISC: 1.0000 | Freq: Three times a day (TID) | 1 refills | Status: AC

## 2023-10-05 MED ORDER — INSULIN LISPRO (1 UNIT DIAL) 100 UNIT/ML (KWIKPEN): 5.0000 [IU] | PEN_INJECTOR | Freq: Three times a day (TID) | SUBCUTANEOUS | 11 refills | Status: DC

## 2023-10-05 NOTE — Patient Instructions (Signed)
  VISIT SUMMARY: Today, we discussed your recent elevated blood sugar levels and associated symptoms, including nausea, severe headache, and fatigue. We reviewed your history of diabetes and recent changes in your condition, including your recent ER visit for hyperglycemia. We also addressed your cardiac symptoms, including palpitations and episodes of low heart rate.  YOUR PLAN: -TYPE 2 DIABETES MELLITUS WITH HYPERGLYCEMIA: You have type 2 diabetes, which means your body has difficulty regulating blood sugar levels. We will restart Mounjaro with a gradual dose increase and initiate Lantus at 20 units at night, along with 5 units of insulin  with meals. We will also order a Dexcom device for continuous glucose monitoring. Please follow up in one month to review your insulin  doses, and contact us  if your blood glucose remains consistently over 300 mg/dL despite insulin  and Mounjaro. Do not adjust your insulin  doses without consulting us .  -OBESITY, CLASS II, BMI 35-39.9: Obesity can contribute to type 2 diabetes and affect your overall health. Managing your weight is important for controlling your blood sugar levels and reducing cardiovascular risks. We recommend focusing on a healthy diet and regular physical activity.  -PAROXYSMAL SUPRAVENTRICULAR TACHYCARDIA: This condition involves episodes of rapid heart rate. You have also experienced bradycardia, which is an unusually slow heart rate. We will monitor your heart rate and symptoms, and if the bradycardia persists, we may consider discontinuing metoprolol .  INSTRUCTIONS: Please follow up in one month to review your insulin  doses. Contact us  if your blood glucose remains consistently over 300 mg/dL despite insulin  and Mounjaro. Do not adjust your insulin  doses without consulting us .

## 2023-10-05 NOTE — Progress Notes (Signed)
 Established Patient Office Visit  Subjective   Patient ID: [name], female    DOB: 12-Jan-2000  Age: 23 y.o. MRN: [id number]  Chief Complaint  Patient presents with   Transitions Of Care    D/C from ED on 10/04/23. Last reading was 10am in the 360s. Does not feel well    HPI  Discussed the use of AI scribe software for clinical note transcription with the patient, who gave verbal consent to proceed.  History of Present Illness [name] is a 23 year old female with diabetes who presents with elevated blood sugar levels and associated symptoms. She is accompanied by her mother.  She has been experiencing nausea and a severe headache, initially suspecting hypoglycemia due to not eating. After consuming a cake pop, her symptoms worsened, and her blood sugar was measured at over 330 mg/dL. Subsequent testing showed moderate ketones, prompting a call to the clinic and a recommendation to visit the ER. Her blood sugar later rose above 400 mg/dL, leading to an ER visit where she received fluids, which helped reduce her blood sugar to around 300 mg/dL.  She has a history of diabetes diagnosed at age 80-14, with severe episodes requiring hospitalization by age 56-16. Various medications were tried without success until she received an IUD at 17-18, which helped stabilize her condition. Increased physical activity also contributed to improvement, and she was medication-free for a period. However, three years ago, her condition began to worsen again, leading to the resumption of medication, specifically Mounjaro, which was effective until she had to stop it due to pregnancy. During her pregnancy, her diabetes worsened but did not reach the current high levels. Post-pregnancy, her A1c was 5.4%, and she was not considered diabetic, leading to a lack of insurance coverage for medications. She stopped breastfeeding six months ago, which may have contributed to recent changes.  Over the past  month, she has experienced increased fatigue, frequent urination, and thirst, which she initially attributed to post-pregnancy changes. Her blood sugar levels have been rising during this time, with a recent reading of 360 mg/dL. She continues to monitor her blood sugar at home.  She also reports cardiac symptoms, including palpitations and a new occurrence of her heart rate dropping into the 40s, which is unusual for her as it typically increases. She experienced near syncope during these episodes. She takes metoprolol  and folic acid, having recently stopped birth control. No recent illness, chest pain, or new medications.     Objective:     BP 98/66 (BP Location: Left Arm, Patient Position: Sitting, Cuff Size: Large)   Pulse 85   Ht 5' 5 (1.651 m)   Wt 243 lb 6.4 oz (110.4 kg)   LMP 10/04/2023 (Approximate)   BMI 40.50 kg/m   Physical Exam  Gen: Well-appearing woman Neck: Normal thyroid , no nodules or adenopathy Heart: 2 out of 6 early stock murmur best heard at the right upper sternal border, mildly irregular rhythm Ext: Warm, no edema  Results for orders placed or performed in visit on 10/05/23  POCT glycosylated hemoglobin (Hb A1C)  Result Value Ref Range   Hemoglobin A1C 11.3 (A) 4.0 - 5.6 %   HbA1c POC (<> result, manual entry)     HbA1c, POC (prediabetic range)     HbA1c, POC (controlled diabetic range)      EKG: Completed because of recent bradycardia and irregular rhythm on exam.  EKG is normal sinus rhythm, heart rate 83 bpm, normal axis and  intervals.    Assessment & Plan:    Problem List Items Addressed This Visit       High   Type 2 diabetes mellitus with hyperglycemia (HCC) - Primary (Chronic)   She has type 2 diabetes with severe hyperglycemia, A1c 11%, and insulin  dependence during pregnancy.  It is unclear to me why her diabetes has now returned.  Labs have shown severe hyperglycemia and she is symptomatic with recent ED visit for dehydration.  Also had  ketonuria and some borderline low bicarb levels, so I think we need to restart some insulin  product to prevent ketosis.  Will plan to restart Mounjaro with a gradual dose increase. Initiate Tresiba 20 units at night and administer 5 units of short acting insulin  with meals.  No SGLT2 inhibitor until blood sugar is better controlled.  Ordered Dexcom for continuous glucose monitoring. Follow up in one month to review insulin  doses. Contact if blood glucose remains consistently over 300 mg/dL despite insulin  and Mounjaro.       Relevant Medications   insulin  degludec (TRESIBA FLEXTOUCH) 100 UNIT/ML FlexTouch Pen   tirzepatide (MOUNJARO) 2.5 MG/0.5ML Pen   insulin  lispro (HUMALOG) 100 UNIT/ML KwikPen   Continuous Glucose Sensor (DEXCOM G7 SENSOR) MISC   Insulin  Pen Needle (BD PEN NEEDLE MICRO ULTRAFINE) 32G X 6 MM MISC   Other Relevant Orders   POCT glycosylated hemoglobin (Hb A1C) (Completed)   Paroxysmal supraventricular tachycardia (Chronic)   She experiences recent palpitations and bradycardia with heart rates in the 40s, atypical for her. EKG showed sinus rhythm with a normal rate. Bradycardia etiology is unclear, further monitoring is needed. Monitor heart rate and symptoms. Consider discontinuing metoprolol  if bradycardia recurs.      Relevant Orders   EKG 12-Lead    Return in about 4 weeks (around 11/02/2023) for CGM review.    Cleatus Debby Specking, MD

## 2023-10-05 NOTE — Assessment & Plan Note (Signed)
 She has type 2 diabetes with severe hyperglycemia, A1c 11%, and insulin  dependence during pregnancy.  It is unclear to me why her diabetes has now returned.  Labs have shown severe hyperglycemia and she is symptomatic with recent ED visit for dehydration.  Also had ketonuria and some borderline low bicarb levels, so I think we need to restart some insulin  product to prevent ketosis.  Will plan to restart Mounjaro with a gradual dose increase. Initiate Tresiba 20 units at night and administer 5 units of short acting insulin  with meals.  No SGLT2 inhibitor until blood sugar is better controlled.  Ordered Dexcom for continuous glucose monitoring. Follow up in one month to review insulin  doses. Contact if blood glucose remains consistently over 300 mg/dL despite insulin  and Mounjaro.

## 2023-10-05 NOTE — Assessment & Plan Note (Addendum)
 She experiences recent palpitations and bradycardia with heart rates in the 40s, atypical for her. EKG showed sinus rhythm with a normal rate. Bradycardia etiology is unclear, further monitoring is needed. Monitor heart rate and symptoms. Consider discontinuing metoprolol  if bradycardia recurs.

## 2023-10-06 ENCOUNTER — Other Ambulatory Visit (HOSPITAL_COMMUNITY): Payer: Self-pay

## 2023-10-06 ENCOUNTER — Telehealth: Payer: Self-pay

## 2023-10-06 NOTE — Telephone Encounter (Signed)
 Pharmacy Patient Advocate Encounter  Received notification from [employer] that Prior Authorization for  Mounjaro 2.5MG /0.5ML auto-injectors  has been APPROVED from 10/06/23 to 10/05/24. Ran test claim, Copay is $0. This test claim was processed through [facility]- copay amounts may vary at other pharmacies due to pharmacy/plan contracts, or as the patient moves through the different stages of their insurance plan.   PA #/Case ID/Reference #: [id number]

## 2023-10-06 NOTE — Telephone Encounter (Signed)
 Patient stated she needed a PA for long acting pens and dexcom? Can you check on this?

## 2023-10-06 NOTE — Telephone Encounter (Signed)
 Pharmacy Patient Advocate Encounter   Received notification from Pt Calls Messages that prior authorization for [place] Flextouch is required/requested.   Insurance verification completed.   The patient is insured through [employer].   Per test claim: The current 75 day co-pay is, $0.00.  No PA needed at this time. This test claim was processed through [facility]- copay amounts may vary at other pharmacies due to pharmacy/plan contracts, or as the patient moves through the different stages of their insurance plan.

## 2023-10-06 NOTE — Telephone Encounter (Signed)
 Sent mychart message letting patient know of PA approval

## 2023-10-06 NOTE — Telephone Encounter (Signed)
 Pharmacy Patient Advocate Encounter   Received notification from Onbase that prior authorization for Mounjaro 2.5MG /0.5ML auto-injectors is required/requested.   Insurance verification completed.   The patient is insured through [employer].   Per test claim: PA required; PA submitted to above mentioned insurance via Latent Key/confirmation #/EOC [id number] Status is pending

## 2023-10-06 NOTE — Telephone Encounter (Signed)
 Pharmacy Patient Advocate Encounter   Received notification from Pt Calls Messages that prior authorization for Dexcom G7 Sensor is required/requested.   Insurance verification completed.   The patient is insured through [employer].   Per test claim: PA required; PA submitted to above mentioned insurance via Latent Key/confirmation #/EOC [id number] Status is pending

## 2023-10-09 NOTE — Telephone Encounter (Signed)
Called patient and left vm to return call.

## 2023-10-09 NOTE — Telephone Encounter (Signed)
 Pharmacy Patient Advocate Encounter  Received notification from [employer] that Prior Authorization for [facility] G7 SENSOR has been APPROVED from 10/06/23 to 10/05/24   PA #/Case ID/Reference #: [id number]

## 2023-10-09 NOTE — Telephone Encounter (Signed)
Called patient and lvm to return call. 

## 2023-10-10 NOTE — Telephone Encounter (Signed)
MyChart message sent to patient letting her know.

## 2023-10-19 ENCOUNTER — Encounter: Payer: Self-pay | Admitting: Student in an Organized Health Care Education/Training Program

## 2023-10-19 DIAGNOSIS — E1165 Type 2 diabetes mellitus with hyperglycemia: Secondary | ICD-10-CM

## 2023-10-19 MED ORDER — DEXCOM G7 SENSOR MISC: 2 refills | Status: DC

## 2023-10-19 MED ORDER — INSULIN LISPRO (1 UNIT DIAL) 100 UNIT/ML (KWIKPEN): 7.0000 [IU] | PEN_INJECTOR | Freq: Three times a day (TID) | SUBCUTANEOUS | Status: DC

## 2023-10-19 MED ORDER — TRESIBA FLEXTOUCH 100 UNIT/ML ~~LOC~~ SOPN: 25.0000 [IU] | PEN_INJECTOR | Freq: Every day | SUBCUTANEOUS | Status: DC

## 2023-10-19 NOTE — Telephone Encounter (Signed)
 Please view message below patient is having spikes in sugar and headaches,dizzy,fatigue still. Complications with Dexacom as well. Would you like patient to come in to be seen sooner than 10/28?

## 2023-10-24 ENCOUNTER — Emergency Department (HOSPITAL_BASED_OUTPATIENT_CLINIC_OR_DEPARTMENT_OTHER): Admitting: Radiology

## 2023-10-24 ENCOUNTER — Emergency Department (HOSPITAL_BASED_OUTPATIENT_CLINIC_OR_DEPARTMENT_OTHER): Admission: EM | Admit: 2023-10-24 | Discharge: 2023-10-24 | Disposition: A | Discharge status: Home / Self Care

## 2023-10-24 ENCOUNTER — Other Ambulatory Visit: Payer: Self-pay

## 2023-10-24 DIAGNOSIS — R739 Hyperglycemia, unspecified: Secondary | ICD-10-CM | POA: Insufficient documentation

## 2023-10-24 DIAGNOSIS — M79675 Pain in left toe(s): Secondary | ICD-10-CM | POA: Insufficient documentation

## 2023-10-24 DIAGNOSIS — X501XXA Overexertion from prolonged static or awkward postures, initial encounter: Secondary | ICD-10-CM | POA: Diagnosis not present

## 2023-10-24 DIAGNOSIS — Z794 Long term (current) use of insulin: Secondary | ICD-10-CM | POA: Diagnosis not present

## 2023-10-24 DIAGNOSIS — M79672 Pain in left foot: Secondary | ICD-10-CM | POA: Diagnosis not present

## 2023-10-24 NOTE — ED Provider Notes (Signed)
 [place] EMERGENCY DEPARTMENT AT [facility] Provider Note   CSN: [id number] Arrival date & time: 10/24/23  2121     Patient presents with: Foot Injury (left)   [name] is a 23 y.o. female.    Foot Injury Associated symptoms: no back pain and no fever     Patient presents because of foot injury.  Patient states that she stepped off the bed and subsequently her 5th, 4th and 3rd digits of the left foot.  Hurts with ambulation.  No ankle pain.  Did not fall did not hit her head   Previous medical history reviewed : Patient was seen in the ED on October 1 in the setting of hyperglycemia.     Prior to Admission medications   Medication Sig Start Date End Date Taking? Authorizing Provider  ACCU-CHEK GUIDE TEST test strip 1 each by Other route as needed. 05/08/23   [provider]  Continuous Glucose Sensor (DEXCOM G7 SENSOR) MISC Apply one sensor to the skin every 10 days 10/19/23   Jerrell Cleatus Ned, MD  insulin  degludec (TRESIBA FLEXTOUCH) 100 UNIT/ML FlexTouch Pen Inject 25 Units into the skin daily. 10/19/23   Jerrell Cleatus Ned, MD  insulin  lispro (HUMALOG) 100 UNIT/ML KwikPen Inject 7 Units into the skin 3 (three) times daily before meals. 10/19/23   Jerrell Cleatus Ned, MD  Insulin  Pen Needle (BD PEN NEEDLE MICRO ULTRAFINE) 32G X 6 MM MISC 1 each by Does not apply route in the morning, at noon, and at bedtime. 10/05/23   Jerrell Cleatus Ned, MD  metoprolol  succinate (TOPROL -XL) 50 MG 24 hr tablet Take 1 tablet (50 mg total) by mouth daily. 08/07/23   Jerrell Cleatus Ned, MD  tirzepatide [facility]) 2.5 MG/0.5ML Pen Inject 2.5 mg into the skin once a week. 10/05/23   Jerrell Cleatus Ned, MD    Allergies: Patient has no known allergies.    Review of Systems  Constitutional:  Negative for chills and fever.  HENT:  Negative for ear pain and sore throat.   Eyes:  Negative for pain and visual disturbance.  Respiratory:  Negative for  cough and shortness of breath.   Cardiovascular:  Negative for chest pain and palpitations.  Gastrointestinal:  Negative for abdominal pain and vomiting.  Genitourinary:  Negative for dysuria and hematuria.  Musculoskeletal:  Negative for arthralgias and back pain.  Skin:  Negative for color change and rash.  Neurological:  Negative for seizures and syncope.  All other systems reviewed and are negative.   Updated Vital Signs BP (!) 143/85 (BP Location: Left Arm)   Pulse 80   Temp 97.8 F (36.6 C)   Resp 19   LMP 10/04/2023 (Approximate)   SpO2 97%   Physical Exam Vitals and nursing note reviewed.  Constitutional:      General: She is not in acute distress.    Appearance: She is well-developed.  HENT:     Head: Normocephalic and atraumatic.  Eyes:     Conjunctiva/sclera: Conjunctivae normal.  Cardiovascular:     Rate and Rhythm: Normal rate and regular rhythm.     Heart sounds: No murmur heard. Pulmonary:     Effort: Pulmonary effort is normal. No respiratory distress.     Breath sounds: Normal breath sounds.  Abdominal:     Palpations: Abdomen is soft.     Tenderness: There is no abdominal tenderness.  Musculoskeletal:        General: No swelling.     Cervical back: Neck  supple.  Skin:    General: Skin is warm and dry.     Capillary Refill: Capillary refill takes less than 2 seconds.  Neurological:     Mental Status: She is alert.  Psychiatric:        Mood and Affect: Mood normal.     (all labs ordered are listed, but only abnormal results are displayed) Labs Reviewed - No data to display  EKG: None  Radiology: DG Foot Complete Left Result Date: 10/24/2023 CLINICAL DATA:  Left foot twisting injury, second and third digit pain EXAM: LEFT FOOT - COMPLETE 3+ VIEW COMPARISON:  None Available. FINDINGS: Frontal, oblique, and lateral views of the left foot are obtained. No acute fracture, subluxation, or dislocation. Joint spaces are well preserved. Soft tissues  are unremarkable. IMPRESSION: 1. Unremarkable left foot. Electronically Signed   By: Ozell Daring M.D.   On: 10/24/2023 21:48     Procedures   Medications Ordered in the ED - No data to display                                  Medical Decision Making Amount and/or Complexity of Data Reviewed Radiology: ordered.     HPI:   Patient presents because of foot injury.  Patient states that she stepped off the bed and subsequently her 5th, 4th and 3rd digits of the left foot.  Hurts with ambulation.  No ankle pain.  Did not fall did not hit her head   Previous medical history reviewed : Patient was seen in the ED on October 1 in the setting of hyperglycemia.     MDM:   Upon exam, pain to palpation of the left 5th, 4th and 3rd digits.  No obvious deformity.  X-ray unremarkable.  No other traumatic workup.  Likely just bruised.  Recommended symptomatic support.  Pain control as below:For pain, you can take 1000 mg of Tylenol  or 1 g of Tylenol  every 6-8 hours.  Do not exceed more than 4000 mg or 4 g in a 24-hour period.  You can also take ibuprofen  600 to 800 mg every 6-8 hours as well.  Do not take this high-dose ibuprofen  for greater than a week.      I have independently interpreted the XR  images and agree with the radiologist finding   Social Determinant of Health: none   Disposition and Follow Up: )PCP      Final diagnoses:  None    ED Discharge Orders     None          Simon Lavonia SAILOR, MD 10/24/23 2228

## 2023-10-24 NOTE — ED Triage Notes (Signed)
 Rolled off couch and twisted ankle. Painful and cannot stand on left foot.

## 2023-10-24 NOTE — Discharge Instructions (Signed)
 For pain, you can take 1000 mg of Tylenol  or 1 g of Tylenol  every 6-8 hours.  Do not exceed more than 4000 mg or 4 g in a 24-hour period.  You can also take ibuprofen  600 to 800 mg every 6-8 hours as well.  Do not take this high-dose ibuprofen  for greater than a week.

## 2023-10-31 ENCOUNTER — Ambulatory Visit: Admitting: Student in an Organized Health Care Education/Training Program

## 2023-10-31 ENCOUNTER — Encounter: Payer: Self-pay | Admitting: Student in an Organized Health Care Education/Training Program

## 2023-10-31 VITALS — BP 136/66 | HR 80 | Wt 242.0 lb

## 2023-10-31 DIAGNOSIS — Z23 Encounter for immunization: Secondary | ICD-10-CM

## 2023-10-31 DIAGNOSIS — F39 Unspecified mood [affective] disorder: Secondary | ICD-10-CM | POA: Diagnosis not present

## 2023-10-31 DIAGNOSIS — E1165 Type 2 diabetes mellitus with hyperglycemia: Secondary | ICD-10-CM

## 2023-10-31 DIAGNOSIS — Z794 Long term (current) use of insulin: Secondary | ICD-10-CM

## 2023-10-31 DIAGNOSIS — I471 Supraventricular tachycardia, unspecified: Secondary | ICD-10-CM

## 2023-10-31 DIAGNOSIS — E611 Iron deficiency: Secondary | ICD-10-CM | POA: Diagnosis not present

## 2023-10-31 LAB — IBC + FERRITIN
Ferritin: 76 ng/mL (ref 10.0–291.0)
Iron: 110 ug/dL (ref 42–145)
Saturation Ratios: 28.5 % (ref 20.0–50.0)
TIBC: 386.4 ug/dL (ref 250.0–450.0)
Transferrin: 276 mg/dL (ref 212.0–360.0)

## 2023-10-31 LAB — TSH: TSH: 1.23 u[IU]/mL (ref 0.35–5.50)

## 2023-10-31 MED ORDER — SERTRALINE HCL 50 MG PO TABS: 50.0000 mg | ORAL_TABLET | Freq: Every day | ORAL | 3 refills | Status: AC

## 2023-10-31 MED ORDER — INSULIN ASPART 100 UNIT/ML IJ SOLN: 10.0000 [IU] | Freq: Three times a day (TID) | INTRAMUSCULAR | 5 refills | Status: DC

## 2023-10-31 MED ORDER — DEXCOM G7 SENSOR MISC: 2 refills | Status: DC

## 2023-10-31 NOTE — Assessment & Plan Note (Addendum)
 Significant stress and anxiety impact daily life. No depressive symptoms, but family history of bipolar disorder noted. Start Zoloft 50 mg, beginning with half a tablet daily for the first week, then increase to a full tablet. Monitor for worsening anxiety or mood symptoms. High stress and inadequate sleep may affect overall health. Encourage stress reduction techniques, prioritizing sleep, and regular exercise. Consider sleep study if symptoms of sleep apnea persist.

## 2023-10-31 NOTE — Assessment & Plan Note (Signed)
 Chronic issue, much improved over the last few weeks. Difficulty timing insulin  with meals affects postprandial glucose. Dexcom shows average glucose 177 mg/dL, 44% time in range, no significant hypoglycemia.  Average blood glucose overnight looks really good, she is having some hyperglycemia after meals in the afternoons.  Increase mealtime insulin  to 10 units for full meals and 7 units for smaller meals. Continue 25 units of Tresiba at night. Prescribe three Dexcom sensors per month with two refills. Switch to Novolog  pen for short-acting insulin  as she was having some difficulty with the Humalog pen.

## 2023-10-31 NOTE — Patient Instructions (Signed)
  VISIT SUMMARY: You came in today for a follow-up visit after starting insulin  therapy for your diabetes. We discussed your blood sugar levels, which have improved overall, but you are still experiencing morning spikes. We also talked about your ongoing issues with heart rate, dizziness, and anxiety, as well as your sleep and stress levels.  YOUR PLAN: -TYPE 2 DIABETES MELLITUS WITH HYPERGLYCEMIA: Type 2 diabetes is a condition where your body does not use insulin  properly, leading to high blood sugar levels. We will increase your mealtime insulin  to 10 units for full meals and 7 units for smaller meals, and continue with 25 units of Tresiba at night. You will also switch to a Novolog  pen for short-acting insulin . We have prescribed three Dexcom sensors per month with two refills to help monitor your blood sugar levels.  -PAROXYSMAL SUPRAVENTRICULAR TACHYCARDIA: Paroxysmal supraventricular tachycardia is a condition where your heart suddenly beats much faster than normal. You will continue taking metoprolol  50 mg daily to help manage your heart rate. It's important to stay hydrated, especially when your blood sugar is high, as dehydration can worsen your symptoms.  -ANXIETY DISORDER: Anxiety disorder is a condition characterized by excessive worry and stress. We will start you on Zoloft 50 mg, beginning with half a tablet daily for the first week, then increasing to a full tablet. Please monitor for any worsening of anxiety or mood symptoms.  -GENERAL HEALTH MAINTENANCE: High stress and inadequate sleep can affect your overall health. We recommend practicing stress reduction techniques, prioritizing sleep, and engaging in regular exercise. If you continue to experience symptoms of sleep apnea, a sleep study may be necessary.  INSTRUCTIONS: Please follow up as scheduled to monitor your blood sugar levels and heart rate. If you experience any worsening of symptoms or have any concerns, contact our office  immediately. Continue with the prescribed medications and monitor your symptoms closely.

## 2023-10-31 NOTE — Assessment & Plan Note (Signed)
 History of iron deficiency, has not had returned of periods yet since delivery.  Will check iron levels today given her increased symptoms of dizziness on standing.

## 2023-10-31 NOTE — Progress Notes (Signed)
 Established Patient Office Visit  Patient ID: [name], female    DOB: [birth year]  Age: 23 y.o. MRN: [id number] PCP: Jerrell Cleatus Ned, MD  Chief Complaint  Patient presents with   Medical Management of Chronic Issues    4 week follow up     Subjective:     HPI  Discussed the use of AI scribe software for clinical note transcription with the patient, who gave verbal consent to proceed.  History of Present Illness [name] is a 23 year old female with diabetes who presents for follow-up after starting insulin  therapy.  She is following up one month after initiating insulin  therapy for diabetes. She reports that blood sugar levels have improved, but she continues to experience morning spikes as soon as she gets out of bed. This pattern was also present during her pregnancy. She sometimes forgets to take insulin  before meals, affecting blood sugar control. She is currently taking 7 units of insulin  with meals and 25 units at night. She has experienced issues with her Dexcom sensor, particularly on the last day before changing it, where it provided limited readings.  She has ongoing issues with blood pressure and heart rate. Since 2023, she has experienced high heart rates at rest, reaching 160 bpm, along with dizziness and weakness. A cardiologist evaluation did not identify a cause. Metoprolol  was prescribed, initially helping, but symptoms returned more frequently, leading to an increased dose. During pregnancy, she had minimal issues, but symptoms returned post-breastfeeding. She continues to experience dizziness and variable heart rates, sometimes feeling like she might pass out. She takes 50 mg of metoprolol  daily.  She describes episodes of dizziness where she feels 'not conscious' of her surroundings, likening it to the feeling of realizing one is drunk. These episodes occur even when she is at rest, such as lying on the couch. She also experienced a significant  episode at work, leading to her being sent home. Her symptoms were worse when her blood sugars were high earlier in the month but have improved slightly as her blood sugars have stabilized.  She has a history of stress and anxiety, exacerbated by her responsibilities, including caring for her ten-month-old daughter with medical issues and her mother, who is about to undergo surgery. She reports getting about 17 hours of sleep but never feels well-rested. She has a history of snoring and sometimes wakes herself up, but does not experience gasping for air. Her father has bipolar disorder, and she has been evaluated for mood disorders in the past, with one doctor suggesting bipolar disorder and another suggesting borderline personality disorder.     Objective:     BP 136/66   Pulse 80   Wt 242 lb (109.8 kg)   LMP 10/04/2023 (Approximate)   SpO2 100%   BMI 40.27 kg/m   Physical Exam  Gen: Well-appearing woman Psych: Moderately anxious appearing, normal speech, organized thoughts, scars from prior cutting on her forearms are very well-healed with no new ones Neck: Increased circumference, normal thyroid , no nodules or adenopathy Heart: Regular, no murmur Lungs: Unlabored, clear throughout Ext: Warm, no edema     Assessment & Plan:   Problem List Items Addressed This Visit       High   Mood disorder (Chronic)   Significant stress and anxiety impact daily life. No depressive symptoms, but family history of bipolar disorder noted. Start Zoloft 50 mg, beginning with half a tablet daily for the first week, then increase to a full  tablet. Monitor for worsening anxiety or mood symptoms. High stress and inadequate sleep may affect overall health. Encourage stress reduction techniques, prioritizing sleep, and regular exercise. Consider sleep study if symptoms of sleep apnea persist.      Relevant Medications   sertraline (ZOLOFT) 50 MG tablet   Type 2 diabetes mellitus with hyperglycemia  (HCC) - Primary (Chronic)   Chronic issue, much improved over the last few weeks. Difficulty timing insulin  with meals affects postprandial glucose. Dexcom shows average glucose 177 mg/dL, 44% time in range, no significant hypoglycemia.  Average blood glucose overnight looks really good, she is having some hyperglycemia after meals in the afternoons.  Increase mealtime insulin  to 10 units for full meals and 7 units for smaller meals. Continue 25 units of Tresiba at night. Prescribe three Dexcom sensors per month with two refills. Switch to Novolog  pen for short-acting insulin  as she was having some difficulty with the Humalog pen.      Relevant Medications   Continuous Glucose Sensor (DEXCOM G7 SENSOR) MISC   insulin  aspart (NOVOLOG ) 100 UNIT/ML injection   Paroxysmal supraventricular tachycardia (Chronic)   Dizziness and variable heart rates persist despite metoprolol . Symptoms may be exacerbated by stress and dehydration from hyperglycemia. Continue metoprolol  50 mg daily. Monitor symptoms and consider hydration status during high blood sugar periods.  She has had a pretty extensive workup for this with cardiology just last year.  Had 2 ambulatory monitors, echocardiogram all that was normal.  I do not think we need to repeat this workup at this time.  Will check a TSH and rule out worsening iron deficiency.      Relevant Orders   TSH     Unprioritized   Iron deficiency   History of iron deficiency, has not had returned of periods yet since delivery.  Will check iron levels today given her increased symptoms of dizziness on standing.      Relevant Orders   IBC + Ferritin   Other Visit Diagnoses       Needs flu shot       Relevant Orders   Flu vaccine trivalent PF, 6mos and older(Flulaval,Afluria,Fluarix,Fluzone) (Completed)       Return in about 2 months (around 12/31/2023).    Cleatus Debby Specking, MD [facility]

## 2023-10-31 NOTE — Assessment & Plan Note (Signed)
 Dizziness and variable heart rates persist despite metoprolol . Symptoms may be exacerbated by stress and dehydration from hyperglycemia. Continue metoprolol  50 mg daily. Monitor symptoms and consider hydration status during high blood sugar periods.  She has had a pretty extensive workup for this with cardiology just last year.  Had 2 ambulatory monitors, echocardiogram all that was normal.  I do not think we need to repeat this workup at this time.  Will check a TSH and rule out worsening iron deficiency.

## 2023-11-01 ENCOUNTER — Ambulatory Visit: Payer: Self-pay | Admitting: Student in an Organized Health Care Education/Training Program

## 2023-11-05 ENCOUNTER — Other Ambulatory Visit: Payer: Self-pay | Admitting: Student in an Organized Health Care Education/Training Program

## 2023-11-05 DIAGNOSIS — Z794 Long term (current) use of insulin: Secondary | ICD-10-CM

## 2023-11-06 ENCOUNTER — Other Ambulatory Visit (HOSPITAL_COMMUNITY): Payer: Self-pay

## 2023-11-06 NOTE — Telephone Encounter (Signed)
 Patient aware and doing well. No further questions or concerns.

## 2023-11-06 NOTE — Telephone Encounter (Signed)
 Please advise on 5MG 

## 2023-11-06 NOTE — Telephone Encounter (Signed)
 Pt needs PA Mounjaro

## 2023-11-06 NOTE — Telephone Encounter (Signed)
 Yes.  After 4 weeks of dosing 2.5 mg Mounjaro weekly, it is okay to increase to 5 mg weekly as long as patient is tolerating it well.  New Rx sent.  Please follow-up with the patient to confirm she is doing well on the 2.5 mg dose and has had 4 total dose.

## 2023-11-07 ENCOUNTER — Ambulatory Visit: Admitting: Student in an Organized Health Care Education/Training Program

## 2023-11-08 ENCOUNTER — Other Ambulatory Visit (HOSPITAL_COMMUNITY): Payer: Self-pay

## 2023-11-08 NOTE — Telephone Encounter (Signed)
 Dr. Jerrell  - please advise. I will go ahead and send PA team a message about needing a PA

## 2023-11-09 ENCOUNTER — Other Ambulatory Visit (HOSPITAL_COMMUNITY): Payer: Self-pay

## 2023-11-09 ENCOUNTER — Telehealth: Payer: Self-pay

## 2023-11-09 NOTE — Telephone Encounter (Signed)
 Pharmacy Patient Advocate Encounter   Received notification from Physician's Office that prior authorization for Dexcom G7 sensor  is required/requested.   Insurance verification completed.   The patient is insured through [employer].   Per test claim: PA required; PA submitted to above mentioned insurance via Phone Key/confirmation #/EOC [id number] Status is pending  Qty limit authorization.SABRA

## 2023-11-10 NOTE — Telephone Encounter (Signed)
 Pharmacy Patient Advocate Encounter  Received notification from [employer] that Prior Authorization for Dexcom G7 Sensor has been APPROVED from 11/09/2023 to 12/09/2023   PA #/Case ID/Reference #: [id number]

## 2023-11-16 ENCOUNTER — Other Ambulatory Visit

## 2023-11-21 NOTE — Telephone Encounter (Signed)
Patient aware of PA approval.

## 2023-11-23 ENCOUNTER — Ambulatory Visit: Admitting: "Endocrinology

## 2024-01-01 ENCOUNTER — Ambulatory Visit: Admitting: Student in an Organized Health Care Education/Training Program

## 2024-01-01 ENCOUNTER — Encounter: Payer: Self-pay | Admitting: Student in an Organized Health Care Education/Training Program

## 2024-01-01 VITALS — BP 126/48 | HR 84 | Wt 235.0 lb

## 2024-01-01 DIAGNOSIS — I471 Supraventricular tachycardia, unspecified: Secondary | ICD-10-CM | POA: Diagnosis not present

## 2024-01-01 DIAGNOSIS — Z794 Long term (current) use of insulin: Secondary | ICD-10-CM

## 2024-01-01 DIAGNOSIS — F39 Unspecified mood [affective] disorder: Secondary | ICD-10-CM | POA: Diagnosis not present

## 2024-01-01 DIAGNOSIS — E1165 Type 2 diabetes mellitus with hyperglycemia: Secondary | ICD-10-CM | POA: Diagnosis not present

## 2024-01-01 DIAGNOSIS — E66812 Obesity, class 2: Secondary | ICD-10-CM | POA: Diagnosis not present

## 2024-01-01 LAB — POCT GLYCOSYLATED HEMOGLOBIN (HGB A1C): Hemoglobin A1C: 5.5 % (ref 4.0–5.6)

## 2024-01-01 MED ORDER — TIRZEPATIDE 7.5 MG/0.5ML ~~LOC~~ SOAJ: 7.5000 mg | SUBCUTANEOUS | 1 refills | Status: AC

## 2024-01-01 NOTE — Assessment & Plan Note (Signed)
 Previously poorly controlled, now improved with A1c at 5.5% due to weight loss and dietary changes. Insulin  was discontinued due to hypoglycemia. Mounjaro  is effective for glucose control. Discontinue insulin  therapy. Increase Mounjaro  (tirzepatide ) dose to 7.5 mg weekly for additional weight loss. Discontinue Dexcom continuous glucose monitoring. Check A1c every three months in the office. Optional home blood glucose monitoring if feeling unwell.

## 2024-01-01 NOTE — Assessment & Plan Note (Signed)
 Intermittent episodes with no significant change in symptoms. Continue metoprolol  50 mg daily.

## 2024-01-01 NOTE — Patient Instructions (Signed)
" °  VISIT SUMMARY: Today, we reviewed your diabetes management and made some adjustments to your treatment plan. We also discussed your heart condition, mood disorder, and polycystic ovary syndrome (PCOS).  YOUR PLAN: -TYPE 2 DIABETES MELLITUS: Type 2 diabetes is a condition where your body does not use insulin  properly, leading to high blood sugar levels. Your diabetes control has improved with weight loss and dietary changes, and your A1c is now at 5.5%. We will discontinue your insulin  therapy due to hypoglycemia and increase your Mounjaro  dose to 7.5 mg weekly to help with additional weight loss. You can stop using the Dexcom continuous glucose monitor, but you may check your blood sugar at home if you feel unwell. We will check your A1c every three months in the office.  -PAROXYSMAL SUPRAVENTRICULAR TACHYCARDIA: Paroxysmal supraventricular tachycardia is a condition where your heart suddenly beats very fast. Your symptoms have not changed, so you should continue taking metoprolol  50 mg daily.  -MOOD DISORDER: Your mood disorder is well-managed with sertraline  (Zoloft ), and you are experiencing benefits without side effects. Continue taking sertraline  50 mg daily.  -POLYCYSTIC OVARY SYNDROME: Polycystic ovary syndrome (PCOS) is a hormonal disorder that can affect your metabolism and weight. Your metabolic control and weight have improved, possibly due to Mounjaro . Continue your current management with Mounjaro .  INSTRUCTIONS: Please follow up in three months for your next A1c test. If you experience any issues or have concerns before then, do not hesitate to contact the office.  "

## 2024-01-01 NOTE — Progress Notes (Signed)
 "  Established Patient Office Visit  Patient ID: [name], female    DOB: 25-Oct-2000  Age: 23 y.o. MRN: [id number] PCP: Jerrell Cleatus Ned, MD  Chief Complaint  Patient presents with   Diabetes    2 month follow up   Eye exam- has not completed     Subjective:     HPI  Discussed the use of AI scribe software for clinical note transcription with the patient, who gave verbal consent to proceed.  History of Present Illness [name] is a 23 year old female with diabetes who presents for follow-up on her diabetes management.  Over the past two months, she has encountered difficulties with her Dexcom monitor due to insurance and pharmacy issues, necessitating the use of a meter for blood sugar testing. She experienced multiple hypoglycemic episodes with blood sugar readings in the fifties after meals, leading her to stop using insulin  about a month ago. Initially, she reduced her Novolog  dose from ten units to five units before discontinuing it entirely due to concerns about low blood sugar levels, especially overnight.  She stopped her long-acting insulin  around the second week of December, coinciding with her father's surgery. Despite discontinuing insulin , her morning blood sugar levels remain slightly high but have improved compared to before. She is currently using Mounjaro  at a dose of five milligrams weekly, which she has been on for at least two months. She feels that this medication is aiding in weight management, as she has lost eight pounds over the past three months.  No increased urination, excessive thirst, or lightheadedness. She is currently taking metoprolol , and her blood pressure is reported to be good. She also has polycystic ovary syndrome (PCOS), which has affected her diabetes management in the past, particularly during pregnancy. Her dietary habits have changed, focusing on eating less and improving the quality of her food intake, which she attributes to  her improved diabetes control.     Objective:     BP (!) 126/48   Pulse 84   Wt 235 lb (106.6 kg)   SpO2 99%   BMI 39.11 kg/m   Physical Exam  Gen: Well-appearing woman Neck: Mildly diffuse enlarged thyroid , no nodules or adenopathy Heart: Regular, no murmur Lungs: Unlabored, clear throughout Ext:   Results for orders placed or performed in visit on 01/01/24  POCT glycosylated hemoglobin (Hb A1C)  Result Value Ref Range   Hemoglobin A1C 5.5 4.0 - 5.6 %   HbA1c POC (<> result, manual entry)     HbA1c, POC (prediabetic range)     HbA1c, POC (controlled diabetic range)        Assessment & Plan:   Problem List Items Addressed This Visit       High   Obesity, Class II, 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  Weight today 235 pounds with BMI 39.  She has lost 8 pounds over the last 3 months.  Doing very well on Mounjaro  with diabetes.  Will increase that dose to 7.5 mg weekly follow-up on weight in 3 months.  We talked about diet changes.      Relevant Medications   tirzepatide  (MOUNJARO ) 7.5 MG/0.5ML Pen   Mood disorder (Chronic)   Well-managed on sertraline  with no side effects and perceived benefit. Continue sertraline  (Zoloft ) 50 mg daily.      Type 2 diabetes mellitus with hyperglycemia (HCC) - Primary (Chronic)   Previously poorly controlled, now improved with A1c at 5.5% due to weight loss  and dietary changes. Insulin  was discontinued due to hypoglycemia. Mounjaro  is effective for glucose control. Discontinue insulin  therapy. Increase Mounjaro  (tirzepatide ) dose to 7.5 mg weekly for additional weight loss. Discontinue Dexcom continuous glucose monitoring. Check A1c every three months in the office. Optional home blood glucose monitoring if feeling unwell.      Relevant Medications   tirzepatide  (MOUNJARO ) 7.5 MG/0.5ML Pen   Other Relevant Orders   POCT glycosylated hemoglobin (Hb A1C) (Completed)   Paroxysmal supraventricular tachycardia (Chronic)    Intermittent episodes with no significant change in symptoms. Continue metoprolol  50 mg daily.       Return in about 3 months (around 03/31/2024) for Diabetes management.    Cleatus Debby Specking, MD [facility]   "

## 2024-01-01 NOTE — Assessment & Plan Note (Signed)
 Chronic and improving.  Weight today 235 pounds with BMI 39.  She has lost 8 pounds over the last 3 months.  Doing very well on Mounjaro  with diabetes.  Will increase that dose to 7.5 mg weekly follow-up on weight in 3 months.  We talked about diet changes.

## 2024-01-01 NOTE — Assessment & Plan Note (Signed)
 Well-managed on sertraline  with no side effects and perceived benefit. Continue sertraline  (Zoloft ) 50 mg daily.

## 2024-03-29 ENCOUNTER — Ambulatory Visit: Admitting: Student in an Organized Health Care Education/Training Program
